# Patient Record
Sex: Male | Born: 1945 | Race: Black or African American | Hispanic: No | Marital: Single | Smoking: Never smoker
Health system: Southern US, Community
[De-identification: ages and names within clinical notes are randomized; demographics above are authoritative.]

## PROBLEM LIST (undated history)

## (undated) DIAGNOSIS — Z789 Other specified health status: Secondary | ICD-10-CM

## (undated) DIAGNOSIS — I10 Essential (primary) hypertension: Secondary | ICD-10-CM

## (undated) HISTORY — PX: COLON SURGERY: SHX602

## (undated) HISTORY — PX: COLOSTOMY: SHX63

---

## 2006-06-19 ENCOUNTER — Inpatient Hospital Stay (HOSPITAL_COMMUNITY): Admission: EM | Admit: 2006-06-19 | Discharge: 2006-06-28 | Payer: Self-pay | Admitting: Emergency Medicine

## 2013-11-11 ENCOUNTER — Encounter (HOSPITAL_COMMUNITY): Payer: Self-pay | Admitting: Emergency Medicine

## 2013-11-11 ENCOUNTER — Emergency Department (HOSPITAL_COMMUNITY)
Admission: EM | Admit: 2013-11-11 | Discharge: 2013-11-11 | Disposition: A | Discharge status: Home / Self Care | Payer: Medicare PPO | Attending: Emergency Medicine | Admitting: Emergency Medicine

## 2013-11-11 ENCOUNTER — Emergency Department (HOSPITAL_COMMUNITY): Payer: Medicare PPO

## 2013-11-11 DIAGNOSIS — Z23 Encounter for immunization: Secondary | ICD-10-CM | POA: Insufficient documentation

## 2013-11-11 DIAGNOSIS — S61309A Unspecified open wound of unspecified finger with damage to nail, initial encounter: Secondary | ICD-10-CM

## 2013-11-11 DIAGNOSIS — Z792 Long term (current) use of antibiotics: Secondary | ICD-10-CM | POA: Insufficient documentation

## 2013-11-11 DIAGNOSIS — S62639B Displaced fracture of distal phalanx of unspecified finger, initial encounter for open fracture: Secondary | ICD-10-CM | POA: Insufficient documentation

## 2013-11-11 DIAGNOSIS — W230XXA Caught, crushed, jammed, or pinched between moving objects, initial encounter: Secondary | ICD-10-CM | POA: Insufficient documentation

## 2013-11-11 DIAGNOSIS — Y9389 Activity, other specified: Secondary | ICD-10-CM | POA: Insufficient documentation

## 2013-11-11 DIAGNOSIS — Y929 Unspecified place or not applicable: Secondary | ICD-10-CM | POA: Insufficient documentation

## 2013-11-11 MED ORDER — TETANUS-DIPHTH-ACELL PERTUSSIS 5-2.5-18.5 LF-MCG/0.5 IM SUSP
0.5000 mL | Freq: Once | INTRAMUSCULAR | Status: AC
  Administered 2013-11-11: 0.5 mL via INTRAMUSCULAR
  Filled 2013-11-11: qty 0.5

## 2013-11-11 MED ORDER — LIDOCAINE HCL (PF) 1 % IJ SOLN
5.0000 mL | Freq: Once | INTRAMUSCULAR | Status: AC
  Administered 2013-11-11: 5 mL

## 2013-11-11 MED ORDER — HYDROCODONE-ACETAMINOPHEN 5-325 MG PO TABS: ORAL_TABLET | ORAL | Status: DC

## 2013-11-11 MED ORDER — SULFAMETHOXAZOLE-TRIMETHOPRIM 800-160 MG PO TABS: 1.0000 | ORAL_TABLET | Freq: Two times a day (BID) | ORAL | Status: DC

## 2013-11-11 MED ORDER — LIDOCAINE HCL (PF) 1 % IJ SOLN
INTRAMUSCULAR | Status: AC
  Administered 2013-11-11: 5 mL
  Filled 2013-11-11: qty 5

## 2013-11-11 NOTE — ED Notes (Signed)
Finger splint applied and dressing applied by other ED RN.

## 2013-11-11 NOTE — Discharge Instructions (Signed)
Fingertip Injuries and Amputations Fingertip injuries are common and often get injured because they are last to escape when pulling your hand out of harm's way. You have amputated (cut off) part of your finger. How this turns out depends largely on how much was amputated. If just the tip is amputated, often the end of the finger will grow back and the finger may return to much the same as it was before the injury.  If more of the finger is missing, your caregiver has done the best with the tissue remaining to allow you to keep as much finger as is possible. Your caregiver after checking your injury has tried to leave you with a painless fingertip that has durable, feeling skin. If possible, your caregiver has tried to maintain the finger's length and appearance and preserve its fingernail.  Please read the instructions outlined below and refer to this sheet in the next few weeks. These instructions provide you with general information on caring for yourself. Your caregiver may also give you specific instructions. While your treatment has been done according to the most current medical practices available, unavoidable complications occasionally occur. If you have any problems or questions after discharge, please call your caregiver. HOME CARE INSTRUCTIONS   You may resume normal diet and activities as directed or allowed.  Keep your hand elevated above the level of your heart. This helps decrease pain and swelling.  Keep ice packs (or a bag of ice wrapped in a towel) on the injured area for 15-20 minutes, 03-04 times per day, for the first two days.  Change dressings if necessary or as directed.  Clean the wound daily or as directed.  Only take over-the-counter or prescription medicines for pain, discomfort, or fever as directed by your caregiver.  Keep appointments as directed. SEEK IMMEDIATE MEDICAL CARE IF:  You develop redness, swelling, numbness or increasing pain in the wound.  There is  pus coming from the wound.  You develop an unexplained oral temperature above 102 F (38.9 C) or as your caregiver suggests.  There is a foul (bad) smell coming from the wound or dressing.  There is a breaking open of the wound (edges not staying together) after sutures or staples have been removed. MAKE SURE YOU:   Understand these instructions.  Will watch your condition.  Will get help right away if you are not doing well or get worse. Document Released: 07/25/2005 Document Revised: 11/26/2011 Document Reviewed: 06/23/2008 [facility] Patient Information 2014 [place], [place].  Finger Fracture A finger fracture is when one or more bones in the finger break.  HOME CARE   Wear the splint, tape, or cast as long as told by your doctor.  Keep your fingers in the position your doctor tell you to.  Raise (elevate) the injured area above the level of the heart.  Only take medicine as told by your doctor.  Put ice on the injured area.  Put ice in a plastic bag.  Place a towel between the skin and the bag.  Leave the ice on for 15-20 minutes, 03-04 times a day.  Follow up with your doctor.  Ask what exercises you can do when the splint comes off. GET HELP RIGHT AWAY IF:   The fingernails are white or bluish.  You have pain not helped by medicine.  You cannot move your fingertips.  You lose feeling (numbness) in the injured finger(s). MAKE SURE YOU:   Understand these instructions.  Will watch this condition.  Will  get help right away if you are not doing well or get worse. Document Released: 02/20/2008 Document Revised: 11/26/2011 Document Reviewed: 02/20/2008 [facility] Patient Information 2014 [place], [place].

## 2013-11-11 NOTE — ED Notes (Signed)
Crushed right hand ring finger between 2 pieces of wood this morning.

## 2013-11-11 NOTE — ED Notes (Signed)
Per verbal order, suture tray, 1% lidocaine and sterile gloves placed at bedside.

## 2013-11-12 NOTE — ED Provider Notes (Signed)
CSN: [id number]     Arrival date & time 11/11/13  1127 History   First MD Initiated Contact with Patient 11/11/13 1145     Chief Complaint  Patient presents with  . Finger Injury     (Consider location/radiation/quality/duration/timing/severity/associated sxs/prior Treatment) HPI Comments: Scott Matthews is a 68 y.o. male who presents to the Emergency Department complaining of pain and crush injury to the right ring finger that occurred just prior to ED arrival.  Patient reports deformity of the nail.  Bleeding was controlled with pressure according to patient.  He states that his finger was "mashed" between two large pieces of wood.  He c/o pain with movement of the end of the finger.  He denies numbness or weakness of the finger.  He denies taking blood thinners.  Last tetanus vaccination is unknown.    The history is provided by the patient.    History reviewed. No pertinent past medical history. Past Surgical History  Procedure Laterality Date  . Colon surgery    . Colostomy     History reviewed. No pertinent family history. History  Substance Use Topics  . Smoking status: Never Smoker   . Smokeless tobacco: Not on file  . Alcohol Use: No    Review of Systems  Constitutional: Negative for fever and chills.  Musculoskeletal: Negative for arthralgias, back pain and joint swelling.  Skin: Positive for wound.       Laceration and nail injury of the right ring finger   Neurological: Negative for dizziness, weakness and numbness.  Hematological: Does not bruise/bleed easily.  All other systems reviewed and are negative.      Allergies  Review of patient's allergies indicates no known allergies.  Home Medications   Current Outpatient Rx  Name  Route  Sig  Dispense  Refill  . HYDROcodone-acetaminophen (NORCO/VICODIN) 5-325 MG per tablet      Take one-two tabs po q 4-6 hrs prn pain   24 tablet   0   . sulfamethoxazole-trimethoprim (SEPTRA DS) 800-160 MG per  tablet   Oral   Take 1 tablet by mouth 2 (two) times daily.   20 tablet   0    Pulse 61  Temp(Src) 97.7 F (36.5 C) (Oral)  Resp 18  Ht 5\' 7"  (1.702 m)  Wt 160 lb (72.576 kg)  BMI 25.05 kg/m2  SpO2 100% Physical Exam  Nursing note and vitals reviewed. Constitutional: He is oriented to person, place, and time. He appears well-developed and well-nourished. No distress.  HENT:  Head: Normocephalic and atraumatic.  Cardiovascular: Normal rate, regular rhythm, normal heart sounds and intact distal pulses.   No murmur heard. Pulmonary/Chest: Effort normal and breath sounds normal. No respiratory distress.  Musculoskeletal: He exhibits tenderness. He exhibits no edema.       Right hand: He exhibits tenderness. He exhibits normal range of motion, normal two-point discrimination and normal capillary refill. Normal sensation noted. Normal strength noted.       Hands: Crush injury to the distal right fourth finger with partial avulsion of the proximal nail.  Pt has full ROM  Of the DIP of the finger.  Bleeding controlled.  Radial pulse is brisk, distal sensation intact.  No proximal tenderness   Neurological: He is alert and oriented to person, place, and time. He exhibits normal muscle tone. Coordination normal.  Skin: Skin is warm and dry.    ED Course  NAIL anchored to nail bed Date/Time: 11/11/2013 12:54 PM Performed by: Trisha MangleRIPLETT, Loretta Doutt L.  Authorized by: Maxwell Caul Consent: Verbal consent obtained. written consent not obtained. Risks and benefits: risks, benefits and alternatives were discussed Consent given by: patient Patient understanding: patient states understanding of the procedure being performed Patient consent: the patient's understanding of the procedure matches consent given Imaging studies: imaging studies available Patient identity confirmed: verbally with patient Time out: Immediately prior to procedure a "time out" was called to verify the correct patient,  procedure, equipment, support staff and site/side marked as required. Anesthesia: digital block Local anesthetic: lidocaine 1% without epinephrine Anesthetic total: 2 ml Patient sedated: no Preparation: skin prepped with Betadine and sterile field established Patient tolerance: Patient tolerated the procedure well with no immediate complications. Comments: Location:  Right fourth finger  After anesthesia obtained, avulsed nail was replaced onto the nail bed and anchored using 2 interrupted sutures of 4-0 prolene.  Laceration of the cuticle was closed using 2 sutures of 4-0 prolene.  Two holes placed in the nail with disposable cautery to allow for drainage of the collected blood.     (including critical care time) Labs Review Labs Reviewed - No data to display Imaging Review Dg Finger Ring Right  11/11/2013   CLINICAL DATA:  Right index finger smashing injury.  EXAM: RIGHT RING FINGER 2+V  COMPARISON:  None.  FINDINGS: A comminuted fracture of the tuft of the distal phalanx is seen. No other fractures identified. No evidence of dislocation.  IMPRESSION: Comminuted fracture of the distal phalangeal tuft.   Electronically Signed   By: Myles Rosenthal M.D.   On: 11/11/2013 12:22    EKG Interpretation   None       MDM   Final diagnoses:  Open fracture of tuft of distal phalanx of finger  Nail avulsion, finger    Finger remains NV intact.  Pain improved.  Finger splinted and bandaged by nursing.  tdap updated.  Will prescribe antibiotic.  Pt requesting referral to local orthopedics.    Consulted Dr. Romeo Apple and discussed finding and ED treatment agrees to see pt in his office for recheck and pt agrees to plan , also advised to return for any signs of infection.  The patient appears reasonably screened and/or stabilized for discharge and I doubt any other medical condition or other [facility] requiring further screening, evaluation, or treatment in the ED at this time prior to discharge.       Owyn Raulston L. Trisha Mangle, PA-C 11/12/13 1739

## 2013-11-12 NOTE — ED Provider Notes (Signed)
Medical screening examination/treatment/procedure(s) were performed by non-physician practitioner and as supervising physician I was immediately available for consultation/collaboration.  EKG Interpretation   None         Adith Tejada M Jamier Urbas, DO 11/12/13 1849 

## 2013-11-17 ENCOUNTER — Encounter: Payer: Self-pay | Admitting: Orthopedic Surgery

## 2013-11-17 ENCOUNTER — Ambulatory Visit (INDEPENDENT_AMBULATORY_CARE_PROVIDER_SITE_OTHER): Payer: Medicare PPO | Admitting: Orthopedic Surgery

## 2013-11-17 VITALS — BP 176/106 | Ht 67.0 in | Wt 148.0 lb

## 2013-11-17 DIAGNOSIS — S62639B Displaced fracture of distal phalanx of unspecified finger, initial encounter for open fracture: Secondary | ICD-10-CM | POA: Insufficient documentation

## 2013-11-17 NOTE — Patient Instructions (Signed)
Keep dressing dry

## 2013-11-17 NOTE — Progress Notes (Signed)
Patient ID: Arthor CaptainWaymon C Hulet, male   DOB: 05/12/1946, 68 y.o.   MRN: [id number]  Chief Complaint  Patient presents with  . Follow-up    ER follow up Right ring finger fracture DOI 11/11/13    68 year old male injured on 11/11/2013 he closed the door smashed his finger between 2 sticks of wood  Complains of mild pain some numbness pain is intermittent. Treated in the ER successfully with stitching and repair of the nail. The fracture is considered open was cleaned out well in the ER and is on Bactrim  He reports a negative review of systems.  Vital signs: BP 176/106  Ht 5\' 7"  (1.702 m)  Wt 148 lb (67.132 kg)  BMI 23.17 kg/m2  General the patient is well-developed and well-nourished grooming and hygiene are normal Oriented x3 Mood and affect normal Ambulation normal  Inspection of the right small finger 5 of flexion at the DIP joint full extension joint is stable. Flexor tendon strength is probably intact but cannot be fully assessed at the DIP joint PIP joint is normal skin is clean. He has sutures around the nail which has been repaired he has good capillary refill and color with some decreased sensation of the fingertip  Recommend redressing continue antibiotics return on Monday to have the sutures removed

## 2013-11-23 ENCOUNTER — Ambulatory Visit (INDEPENDENT_AMBULATORY_CARE_PROVIDER_SITE_OTHER): Payer: Medicare PPO | Admitting: Orthopedic Surgery

## 2013-11-23 VITALS — BP 167/107 | Ht 67.0 in | Wt 148.0 lb

## 2013-11-23 DIAGNOSIS — S62639B Displaced fracture of distal phalanx of unspecified finger, initial encounter for open fracture: Secondary | ICD-10-CM | POA: Diagnosis not present

## 2013-11-23 NOTE — Patient Instructions (Signed)
Work on bending the finger

## 2013-11-23 NOTE — Progress Notes (Signed)
Patient ID: Scott Matthews, male   DOB: 1945-11-09, 68 y.o.   MRN: [id number]  Status post crush injury right ring finger distal phalanx with fracture and nailbed repair in ER  Comes in for suture removal area there no signs of infection typical nailbed changes. Stiffness of the DIP joint expected.  Patient instructions to work on flexion come back in 4 weeks all sutures were removed

## 2013-12-29 ENCOUNTER — Ambulatory Visit (INDEPENDENT_AMBULATORY_CARE_PROVIDER_SITE_OTHER): Payer: Medicare PPO | Admitting: Orthopedic Surgery

## 2013-12-29 VITALS — BP 162/117 | Ht 67.0 in | Wt 148.0 lb

## 2013-12-29 DIAGNOSIS — S62639B Displaced fracture of distal phalanx of unspecified finger, initial encounter for open fracture: Secondary | ICD-10-CM

## 2013-12-29 NOTE — Patient Instructions (Signed)
Normal activity   Nail growth will take 3 months

## 2013-12-29 NOTE — Progress Notes (Signed)
Patient ID: [name], male   DOB: 09/19/1945, 68 y.o.   MRN: [id number] Chief Complaint  Patient presents with  . Follow-up    4 week recheck right ring finger DIPJ (ROM CK) DOi 11/11/13   BP 162/117  Ht 5\' 7"  (1.702 m)  Wt 148 lb (67.132 kg)  BMI 23.17 kg/m2 Encounter Diagnosis  Name Primary?  . Fracture, finger, distal phalanx, open Yes   General appearance is normal, the patient is alert and oriented x3 with normal mood and affect.  The patient's finger looks good his motion is returned to normal he does have nail deformity all see him again in 3 months to see how that goes in  Is not having any pain

## 2014-04-06 ENCOUNTER — Ambulatory Visit (INDEPENDENT_AMBULATORY_CARE_PROVIDER_SITE_OTHER): Payer: Medicare PPO | Admitting: Orthopedic Surgery

## 2014-04-06 VITALS — BP 171/103 | Ht 67.0 in | Wt 148.0 lb

## 2014-04-06 DIAGNOSIS — IMO0001 Reserved for inherently not codable concepts without codable children: Secondary | ICD-10-CM

## 2014-04-06 DIAGNOSIS — S62639D Displaced fracture of distal phalanx of unspecified finger, subsequent encounter for fracture with routine healing: Secondary | ICD-10-CM

## 2014-04-06 NOTE — Progress Notes (Signed)
Patient ID: Scott Matthews, male   DOB: Jan 06, 1946, 68 y.o.   MRN: [id number] Chief Complaint  Patient presents with  . Follow-up    3 month recheck right ring finger nail growth, DOI 11/11/13   HISTORY: nail injury   Nail regrowth is good  Tip a little sensitive  Doing well  F/U prn

## 2014-11-12 ENCOUNTER — Encounter (HOSPITAL_COMMUNITY): Payer: Self-pay | Admitting: Emergency Medicine

## 2014-11-12 ENCOUNTER — Emergency Department (HOSPITAL_COMMUNITY): Payer: Commercial Managed Care - HMO

## 2014-11-12 ENCOUNTER — Inpatient Hospital Stay (HOSPITAL_COMMUNITY)
Admission: EM | Admit: 2014-11-12 | Discharge: 2014-11-16 | DRG: 603 | Disposition: A | Discharge status: Skilled Nursing Facility | Payer: Commercial Managed Care - HMO | Attending: Internal Medicine | Admitting: Internal Medicine

## 2014-11-12 DIAGNOSIS — E876 Hypokalemia: Secondary | ICD-10-CM | POA: Diagnosis not present

## 2014-11-12 DIAGNOSIS — I878 Other specified disorders of veins: Secondary | ICD-10-CM | POA: Insufficient documentation

## 2014-11-12 DIAGNOSIS — R531 Weakness: Secondary | ICD-10-CM | POA: Diagnosis not present

## 2014-11-12 DIAGNOSIS — L0390 Cellulitis, unspecified: Secondary | ICD-10-CM | POA: Diagnosis not present

## 2014-11-12 DIAGNOSIS — R262 Difficulty in walking, not elsewhere classified: Secondary | ICD-10-CM | POA: Diagnosis not present

## 2014-11-12 DIAGNOSIS — S81801D Unspecified open wound, right lower leg, subsequent encounter: Secondary | ICD-10-CM | POA: Diagnosis not present

## 2014-11-12 DIAGNOSIS — D649 Anemia, unspecified: Secondary | ICD-10-CM | POA: Diagnosis not present

## 2014-11-12 DIAGNOSIS — L03116 Cellulitis of left lower limb: Principal | ICD-10-CM | POA: Diagnosis present

## 2014-11-12 DIAGNOSIS — L03115 Cellulitis of right lower limb: Secondary | ICD-10-CM | POA: Diagnosis present

## 2014-11-12 DIAGNOSIS — I10 Essential (primary) hypertension: Secondary | ICD-10-CM | POA: Diagnosis present

## 2014-11-12 DIAGNOSIS — L03119 Cellulitis of unspecified part of limb: Secondary | ICD-10-CM | POA: Diagnosis not present

## 2014-11-12 DIAGNOSIS — S81801A Unspecified open wound, right lower leg, initial encounter: Secondary | ICD-10-CM | POA: Diagnosis not present

## 2014-11-12 DIAGNOSIS — S81802A Unspecified open wound, left lower leg, initial encounter: Secondary | ICD-10-CM | POA: Diagnosis not present

## 2014-11-12 DIAGNOSIS — Z8249 Family history of ischemic heart disease and other diseases of the circulatory system: Secondary | ICD-10-CM | POA: Diagnosis not present

## 2014-11-12 DIAGNOSIS — M6281 Muscle weakness (generalized): Secondary | ICD-10-CM | POA: Diagnosis not present

## 2014-11-12 DIAGNOSIS — Z933 Colostomy status: Secondary | ICD-10-CM | POA: Diagnosis not present

## 2014-11-12 DIAGNOSIS — S81802D Unspecified open wound, left lower leg, subsequent encounter: Secondary | ICD-10-CM | POA: Diagnosis not present

## 2014-11-12 DIAGNOSIS — R278 Other lack of coordination: Secondary | ICD-10-CM | POA: Diagnosis not present

## 2014-11-12 DIAGNOSIS — I160 Hypertensive urgency: Secondary | ICD-10-CM | POA: Diagnosis present

## 2014-11-12 DIAGNOSIS — R030 Elevated blood-pressure reading, without diagnosis of hypertension: Secondary | ICD-10-CM | POA: Diagnosis not present

## 2014-11-12 HISTORY — DX: Other specified health status: Z78.9

## 2014-11-12 LAB — COMPREHENSIVE METABOLIC PANEL
ALT: 22 U/L (ref 0–53)
ANION GAP: 7 (ref 5–15)
AST: 27 U/L (ref 0–37)
Albumin: 3.9 g/dL (ref 3.5–5.2)
Alkaline Phosphatase: 63 U/L (ref 39–117)
BUN: 16 mg/dL (ref 6–23)
CALCIUM: 8.9 mg/dL (ref 8.4–10.5)
CO2: 27 mmol/L (ref 19–32)
CREATININE: 0.98 mg/dL (ref 0.50–1.35)
Chloride: 107 mmol/L (ref 96–112)
GFR calc Af Amer: >90 mL/min (ref >90)
GFR, EST NON AFRICAN AMERICAN: 81 mL/min — AB (ref >90)
Glucose, Bld: 101 mg/dL — ABNORMAL HIGH (ref 70–99)
Potassium: 3.8 mmol/L (ref 3.5–5.1)
Sodium: 141 mmol/L (ref 135–145)
Total Bilirubin: 0.6 mg/dL (ref 0.3–1.2)
Total Protein: 7.9 g/dL (ref 6.0–8.3)

## 2014-11-12 LAB — CBC WITH DIFFERENTIAL/PLATELET
Basophils Absolute: 0.1 10*3/uL (ref 0.0–0.1)
Basophils Relative: 1 % (ref 0–1)
Eosinophils Absolute: 0.1 10*3/uL (ref 0.0–0.7)
Eosinophils Relative: 1 % (ref 0–5)
HEMATOCRIT: 40.4 % (ref 39.0–52.0)
HEMOGLOBIN: 12.9 g/dL — AB (ref 13.0–17.0)
LYMPHS PCT: 20 % (ref 12–46)
Lymphs Abs: 1.3 10*3/uL (ref 0.7–4.0)
MCH: 25.3 pg — ABNORMAL LOW (ref 26.0–34.0)
MCHC: 31.9 g/dL (ref 30.0–36.0)
MCV: 79.2 fL (ref 78.0–100.0)
MONO ABS: 0.5 10*3/uL (ref 0.1–1.0)
MONOS PCT: 8 % (ref 3–12)
NEUTROS ABS: 4.5 10*3/uL (ref 1.7–7.7)
Neutrophils Relative %: 70 % (ref 43–77)
Platelets: 244 10*3/uL (ref 150–400)
RBC: 5.1 MIL/uL (ref 4.22–5.81)
RDW: 14.5 % (ref 11.5–15.5)
WBC: 6.5 10*3/uL (ref 4.0–10.5)

## 2014-11-12 MED ORDER — ACETAMINOPHEN 325 MG PO TABS
650.0000 mg | ORAL_TABLET | Freq: Four times a day (QID) | ORAL | Status: DC | PRN
  Administered 2014-11-13: 650 mg via ORAL
  Filled 2014-11-12: qty 2

## 2014-11-12 MED ORDER — SODIUM CHLORIDE 0.9 % IV SOLN: INTRAVENOUS | Status: DC

## 2014-11-12 MED ORDER — HYDRALAZINE HCL 20 MG/ML IJ SOLN
5.0000 mg | Freq: Once | INTRAMUSCULAR | Status: AC
Start: 2014-11-12 — End: 2014-11-12
  Administered 2014-11-12: 21:00:00 via INTRAVENOUS
  Filled 2014-11-12: qty 1

## 2014-11-12 MED ORDER — ONDANSETRON HCL 4 MG PO TABS: 4.0000 mg | ORAL_TABLET | Freq: Four times a day (QID) | ORAL | Status: DC | PRN

## 2014-11-12 MED ORDER — ENOXAPARIN SODIUM 40 MG/0.4ML ~~LOC~~ SOLN
40.0000 mg | SUBCUTANEOUS | Status: DC
  Administered 2014-11-12 – 2014-11-15 (×4): 40 mg via SUBCUTANEOUS
  Filled 2014-11-12 (×4): qty 0.4

## 2014-11-12 MED ORDER — MORPHINE SULFATE 2 MG/ML IJ SOLN
1.0000 mg | INTRAMUSCULAR | Status: DC | PRN
  Administered 2014-11-13 – 2014-11-16 (×5): 1 mg via INTRAVENOUS
  Filled 2014-11-12 (×5): qty 1

## 2014-11-12 MED ORDER — HYDROCODONE-ACETAMINOPHEN 5-325 MG PO TABS
1.0000 | ORAL_TABLET | Freq: Once | ORAL | Status: AC
  Administered 2014-11-12: 1 via ORAL
  Filled 2014-11-12: qty 1

## 2014-11-12 MED ORDER — SODIUM CHLORIDE 0.9 % IV SOLN
INTRAVENOUS | Status: DC
  Administered 2014-11-12 – 2014-11-13 (×2): via INTRAVENOUS

## 2014-11-12 MED ORDER — ACETAMINOPHEN 650 MG RE SUPP: 650.0000 mg | Freq: Four times a day (QID) | RECTAL | Status: DC | PRN

## 2014-11-12 MED ORDER — HYDRALAZINE HCL 20 MG/ML IJ SOLN
5.0000 mg | INTRAMUSCULAR | Status: DC | PRN
  Administered 2014-11-14: 5 mg via INTRAVENOUS
  Filled 2014-11-12: qty 1

## 2014-11-12 MED ORDER — ONDANSETRON HCL 4 MG/2ML IJ SOLN: 4.0000 mg | Freq: Four times a day (QID) | INTRAMUSCULAR | Status: DC | PRN

## 2014-11-12 MED ORDER — OXYCODONE HCL 5 MG PO TABS
5.0000 mg | ORAL_TABLET | ORAL | Status: DC | PRN
Start: 2014-11-12 — End: 2014-11-16
  Administered 2014-11-12 – 2014-11-16 (×9): 5 mg via ORAL
  Filled 2014-11-12 (×10): qty 1

## 2014-11-12 MED ORDER — VANCOMYCIN HCL IN DEXTROSE 1-5 GM/200ML-% IV SOLN
1000.0000 mg | Freq: Two times a day (BID) | INTRAVENOUS | Status: DC
  Administered 2014-11-13 – 2014-11-16 (×7): 1000 mg via INTRAVENOUS
  Filled 2014-11-12 (×9): qty 200

## 2014-11-12 MED ORDER — VANCOMYCIN HCL IN DEXTROSE 1-5 GM/200ML-% IV SOLN
1000.0000 mg | Freq: Once | INTRAVENOUS | Status: AC
  Administered 2014-11-12: 1000 mg via INTRAVENOUS
  Filled 2014-11-12: qty 200

## 2014-11-12 MED ORDER — CIPROFLOXACIN IN D5W 400 MG/200ML IV SOLN
400.0000 mg | Freq: Two times a day (BID) | INTRAVENOUS | Status: DC
  Administered 2014-11-12 – 2014-11-14 (×5): 400 mg via INTRAVENOUS
  Filled 2014-11-12 (×5): qty 200

## 2014-11-12 NOTE — H&P (Signed)
[facility] History and Physical  Scott CaptainWaymon C Schmoker WUJ:[id number] DOB: 11/16/1945 DOA: 11/12/2014  Referring physician: ER physician. PCP: Cassell SmilesFUSCO,LAWRENCE J., MD   Chief Complaint: Lower extremity wound.  HPI: Scott Matthews is a 69 y.o. 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 Patient states he sustained injury while cutting logs of wood. Patient states he has mild discharge from the wound. On exam patient has skin excoriation with erythema and swelling of the both lower extremities. Denies any fever or chills. In the ER in addition patient is found to have very elevated blood pressure. Patient states he was told he had elevated blood pressure last year but he did not follow-up. Patient otherwise denies any chest pain shortness of breath.   Review of Systems: As presented in the history of presenting illness, rest negative.  Past Medical History  Diagnosis Date  . Medical history non-contributory    Past Surgical History  Procedure Laterality Date  . Colon surgery    . Colostomy     Social History:  reports that he has never smoked. He has never used smokeless tobacco. He reports that he does not drink alcohol or use illicit drugs. Where does patient live at home. Can patient participate in ADLs? Yes.  No Known Allergies  Family History:  Family History  Problem Relation Age of Onset  . Hypertension Mother   . Hypertension Father   . CAD Father       Prior to Admission medications   Medication Sig Start Date End Date Taking? Authorizing Provider  HYDROcodone-acetaminophen (NORCO/VICODIN) 5-325 MG per tablet Take one-two tabs po q 4-6 hrs prn pain 11/11/13   Tammy L. Triplett, PA-C  sulfamethoxazole-trimethoprim (SEPTRA DS) 800-160 MG per tablet Take 1  tablet by mouth 2 (two) times daily. 11/11/13   Tammy L. Triplett, PA-C    Physical Exam: Filed Vitals:   11/12/14 1736 11/12/14 2053  BP: 166/145 196/117  Pulse: 84   Temp: 98 F (36.7 C)   TempSrc: Oral   Resp: 18   Height: 5\' 7"  (1.702 m)   Weight: 72.576 kg (160 lb)   SpO2: 100%      General:  Moderately built and nourished.  Eyes: Anicteric no pallor.  ENT: No discharge from the ears eyes nose and mouth.  Neck: No mass felt.  Cardiovascular: S1 and S2 heard.  Respiratory: No rhonchi or crepitations.  Abdomen: Colostomy bag seen. Nontender soft.  Skin: Bilateral anterior shin has skin excoriation with erythema and swelling.  Musculoskeletal: Bilateral pedal edema.  Psychiatric: Appears normal.  Neurologic: Alert awake oriented to time place and person. Moves all extremities.  Labs on Admission:  Basic Metabolic Panel:  Recent Labs Lab 11/12/14 1845  NA 141  K 3.8  CL 107  CO2 27  GLUCOSE 101*  BUN 16  CREATININE 0.98  CALCIUM 8.9   Liver Function Tests:  Recent Labs Lab 11/12/14 1845  AST 27  ALT 22  ALKPHOS 63  BILITOT 0.6  PROT 7.9  ALBUMIN 3.9   No results for input(s): LIPASE, AMYLASE in the last 168 hours. No results for input(s): AMMONIA in the last 168 hours. CBC:  Recent Labs Lab 11/12/14 1845  WBC 6.5  NEUTROABS 4.5  HGB 12.9*  HCT 40.4  MCV 79.2  PLT 244   Cardiac Enzymes: No results for input(s): CKTOTAL, CKMB, CKMBINDEX, TROPONINI in the last 168 hours.  BNP (last 3 results) No results for input(s): BNP in the last 8760 hours.  ProBNP (last 3 results) No results for input(s): PROBNP in the last 8760 hours.  CBG: No results for input(s): GLUCAP in the last 168 hours.  Radiological Exams on Admission: Dg Tibia/fibula Left  11/12/2014   CLINICAL DATA:  Bilateral wounds to the mid anterior lower legs, Black and using. Patient was splitting wood 2 weeks ago and the wood hit the legs.  EXAM: LEFT TIBIA AND FIBULA  - 2 VIEW  COMPARISON:  None.  FINDINGS: Left tibia and fibula appear intact. No evidence of acute fracture or subluxation. No focal bone lesion or bone destruction. Bone cortex and trabecular architecture appear intact. No radiopaque soft tissue foreign bodies.  IMPRESSION: Negative.   Electronically Signed   By: Burman Nieves M.D.   On: 11/12/2014 18:24   Dg Tibia/fibula Right  11/12/2014   CLINICAL DATA:  Bilateral wounds to mid anterior lower legs, black, bruising. Hit in shins with wood while chopping wood 2 weeks ago.  EXAM: RIGHT TIBIA AND FIBULA - 2 VIEW  COMPARISON:  None.  FINDINGS: Mild anterior soft tissue swelling. No radiopaque foreign body. No underlying bony abnormality. No fracture, subluxation or dislocation.  IMPRESSION: No acute bony abnormality or radiopaque foreign body.   Electronically Signed   By: Charlett Nose M.D.   On: 11/12/2014 18:25     Assessment/Plan Active Problems:   Cellulitis   Hypertensive urgency   1. Cellulitis of the both lower extremity - patient states that he has had tetanus toxoid within the last 2 years. Patient has been placed on vancomycin and ciprofloxacin. Check sedimentation rate if elevated may need MRI. Wound consult. Patient has significant lower extremity edema check Dopplers to rule out DVT. 2. Hypertensive urgency - patient's blood pressure was more than 200 when he came. I have placed patient on when necessary IV hydralazine for now. Based on the response and blood pressure trends patient may need definite antihypertensives. 3. Mild anemia - check anemia panel. 4. History of colostomy.   DVT Prophylaxis Lovenox.  Code Status: Full code.  Family Communication: Family at the bedside.  Disposition Plan: Admit to inpatient.    Syona Wroblewski N. [facility] Pager [phone].  If 7PM-7AM, please contact night-coverage [email or web address] Password [facility] 11/12/2014, 8:56 PM

## 2014-11-12 NOTE — Progress Notes (Signed)
ANTIBIOTIC CONSULT NOTE - INITIAL  Pharmacy Consult for Vancomycin and Cipro Indication: cellulitis  No Known Allergies  Patient Measurements: Height: 5\' 7"  (170.2 cm) (Simultaneous filing. User may not have seen previous data.) Weight: 160 lb (72.576 kg) (Simultaneous filing. User may not have seen previous data.) IBW/kg (Calculated) : 66.1  Vital Signs: Temp: 98.1 F (36.7 C) (02/26 2220) Temp Source: Oral (02/26 2220) BP: 164/89 mmHg (02/26 2220) Pulse Rate: 83 (02/26 2220) Intake/Output from previous day:   Intake/Output from this shift: Total I/O In: -  Out: 225 [Urine:225]  Labs:  Recent Labs  11/12/14 1845  WBC 6.5  HGB 12.9*  PLT 244  CREATININE 0.98   Estimated Creatinine Clearance: 65.6 mL/min (by C-G formula based on Cr of 0.98). No results for input(s): VANCOTROUGH, VANCOPEAK, VANCORANDOM, GENTTROUGH, GENTPEAK, GENTRANDOM, TOBRATROUGH, TOBRAPEAK, TOBRARND, AMIKACINPEAK, AMIKACINTROU, AMIKACIN in the last 72 hours.   Microbiology: No results found for this or any previous visit (from the past 720 hour(s)).  Medical History: Past Medical History  Diagnosis Date  . Medical history non-contributory    Anti-infectives    Start     Dose/Rate Route Frequency Ordered Stop   11/13/14 1000  vancomycin (VANCOCIN) IVPB 1000 mg/200 mL premix     1,000 mg 200 mL/hr over 60 Minutes Intravenous Every 12 hours 11/12/14 2306     11/12/14 2330  ciprofloxacin (CIPRO) IVPB 400 mg     400 mg 200 mL/hr over 60 Minutes Intravenous Every 12 hours 11/12/14 2306     11/12/14 2015  vancomycin (VANCOCIN) IVPB 1000 mg/200 mL premix     1,000 mg 200 mL/hr over 60 Minutes Intravenous  Once 11/12/14 2001 11/12/14 2153      Assessment: 68yo male with cellulitis to LE.  Pt has good renal fxn.    Goal of Therapy:  Vancomycin trough level 10-15 mcg/ml  Plan:  Cipro 400mg  IV q12hrs Vancomycin 1000mg  IV q12hrs Check trough at steady state Deescalate ABX when  appropriate Monitor labs and cultures  Valrie HartHall, Madylyn Insco A 11/12/2014,11:07 PM

## 2014-11-12 NOTE — ED Notes (Signed)
Aida PufferLaurie Taylor notified of patient's vital signs.

## 2014-11-12 NOTE — ED Provider Notes (Signed)
CSN: [id number]     Arrival date & time 11/12/14  1732 History   First MD Initiated Contact with Patient 11/12/14 1735     Chief Complaint  Patient presents with  . Wound Infection     (Consider location/radiation/quality/duration/timing/severity/associated sxs/prior Treatment) HPI Comments: Patient presents via EMS with redness and infection to bilateral lower legs. He states he was splitting wood about 2 weeks ago and a piece of board hit his left shin and the metal of the accident his right shin. He was not seen after the injury. Reports worsening pain, redness and swelling over the past several weeks and pain with ambulation. Denies any fevers or vomiting. He is not a diabetic. 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 Called EMS tonight with worsening pain and spreading redness up his shins.  The history is provided by the patient and the EMS personnel.    Past Medical History  Diagnosis Date  . Medical history non-contributory    Past Surgical History  Procedure Laterality Date  . Colon surgery    . Colostomy     Family History  Problem Relation Age of Onset  . Hypertension Mother   . Hypertension Father   . CAD Father    History  Substance Use Topics  . Smoking status: Never Smoker   . Smokeless tobacco: Never Used  . Alcohol Use: No    Review of Systems  Constitutional: Positive for activity change and appetite change. Negative for fever.  HENT: Negative for congestion and rhinorrhea.   Respiratory: Negative for cough, chest tightness and shortness of breath.   Cardiovascular: Positive for leg swelling. Negative for chest pain.  Gastrointestinal: Negative for nausea, vomiting and abdominal pain.  Genitourinary: Negative for dysuria and hematuria.  Musculoskeletal: Negative for myalgias, back pain and arthralgias.  Skin: Positive for wound.  Neurological: Negative for dizziness, weakness and headaches.  A complete 10 system review of  systems was obtained and all systems are negative except as noted in the HPI and PMH.      Allergies  Review of patient's allergies indicates no known allergies.  Home Medications   Prior to Admission medications   Not on File   BP 164/89 mmHg  Pulse 83  Temp(Src) 98.1 F (36.7 C) (Oral)  Resp 18  Ht  (1.702 m)  Wt 160 lb (72.576 kg)  BMI 25.05 kg/m2  SpO2 100% Physical Exam  Constitutional: He is oriented to person, place, and time. He appears well-developed and well-nourished. No distress.  HENT:  Head: Normocephalic and atraumatic.  Mouth/Throat: Oropharynx is clear and moist. No oropharyngeal exudate.  Eyes: Conjunctivae and EOM are normal. Pupils are equal, round, and reactive to light.  Neck: Normal range of motion. Neck supple.  No meningismus.  Cardiovascular: Normal rate, regular rhythm, normal heart sounds and intact distal pulses.   No murmur heard. Pulmonary/Chest: Effort normal and breath sounds normal. No respiratory distress.  Abdominal: Soft. There is no tenderness. There is no rebound and no guarding.  Musculoskeletal: Normal range of motion. He exhibits edema and tenderness.  Cellulitis to bilateral Lower extremities with open crusted wounds.  Erythema to proximal tibia bilaterally Intact DP and PT pulses  Neurological: He is alert and oriented to person, place, and time. No cranial nerve deficit. He exhibits normal muscle tone. Coordination normal.  No ataxia on finger to nose bilaterally. No pronator drift. 5/5 strength throughout. CN 2-12 intact. Negative Romberg. Equal grip strength. Sensation intact. Gait is normal.  Skin: Skin is warm.  Psychiatric: He has a normal mood and affect. His behavior is normal.  Nursing note and vitals reviewed.     ED Course  Procedures (including critical care time) Labs Review Labs Reviewed  CBC WITH DIFFERENTIAL/PLATELET - Abnormal; Notable for the following:    Hemoglobin 12.9 (*)    MCH 25.3 (*)     All other components within normal limits  COMPREHENSIVE METABOLIC PANEL - Abnormal; Notable for the following:    Glucose, Bld 101 (*)    GFR calc non Af Amer 81 (*)    All other components within normal limits  SEDIMENTATION RATE  C-REACTIVE PROTEIN  COMPREHENSIVE METABOLIC PANEL  CBC WITH DIFFERENTIAL/PLATELET  CBC  CBC  CREATININE, SERUM    Imaging Review Dg Tibia/fibula Left  11/12/2014   CLINICAL DATA:  Bilateral wounds to the mid anterior lower legs, Black and using. Patient was splitting wood 2 weeks ago and the wood hit the legs.  EXAM: LEFT TIBIA AND FIBULA - 2 VIEW  COMPARISON:  None.  FINDINGS: Left tibia and fibula appear intact. No evidence of acute fracture or subluxation. No focal bone lesion or bone destruction. Bone cortex and trabecular architecture appear intact. No radiopaque soft tissue foreign bodies.  IMPRESSION: Negative.   Electronically Signed   By: Burman NievesWilliam  Stevens M.D.   On: 11/12/2014 18:24   Dg Tibia/fibula Right  11/12/2014   CLINICAL DATA:  Bilateral wounds to mid anterior lower legs, black, bruising. Hit in shins with wood while chopping wood 2 weeks ago.  EXAM: RIGHT TIBIA AND FIBULA - 2 VIEW  COMPARISON:  None.  FINDINGS: Mild anterior soft tissue swelling. No radiopaque foreign body. No underlying bony abnormality. No fracture, subluxation or dislocation.  IMPRESSION: No acute bony abnormality or radiopaque foreign body.   Electronically Signed   By: Charlett NoseKevin  Dover M.D.   On: 11/12/2014 18:25     EKG Interpretation None      MDM   Final diagnoses:  Cellulitis of lower extremity, unspecified laterality   cellulitis to lower extremities after wound obtained 2 weeks ago. Tetanus up-to-date. Afebrile. Vital stable. Nonseptic and nontoxic appearing.  Xrays negative for FB. Start antibiotics for cellulitis.  No leukocytosis or fever. Does not appear septic. BP elevated.  Patient reports no history of the same. No chest pain, SOB, headache.  Admission  d/w Dr. Toniann FailKakrakandy.   Glynn OctaveStephen Ashutosh Dieguez, MD 11/12/14 [phone]

## 2014-11-12 NOTE — ED Notes (Signed)
Pt reports he was splitting wood and the wood hit one leg and the wedge hit the other leg. Pt now has infection in bilat ant lower legs.

## 2014-11-13 DIAGNOSIS — R030 Elevated blood-pressure reading, without diagnosis of hypertension: Secondary | ICD-10-CM

## 2014-11-13 DIAGNOSIS — E876 Hypokalemia: Secondary | ICD-10-CM

## 2014-11-13 LAB — RETICULOCYTES
RBC.: 4.51 MIL/uL (ref 4.22–5.81)
Retic Count, Absolute: 40.6 10*3/uL (ref 19.0–186.0)
Retic Ct Pct: 0.9 % (ref 0.4–3.1)

## 2014-11-13 LAB — CBC WITH DIFFERENTIAL/PLATELET
BASOS PCT: 1 % (ref 0–1)
Basophils Absolute: 0 10*3/uL (ref 0.0–0.1)
EOS ABS: 0.1 10*3/uL (ref 0.0–0.7)
Eosinophils Relative: 1 % (ref 0–5)
HCT: 35.6 % — ABNORMAL LOW (ref 39.0–52.0)
Hemoglobin: 11.3 g/dL — ABNORMAL LOW (ref 13.0–17.0)
Lymphocytes Relative: 22 % (ref 12–46)
Lymphs Abs: 1.4 10*3/uL (ref 0.7–4.0)
MCH: 25.1 pg — AB (ref 26.0–34.0)
MCHC: 31.7 g/dL (ref 30.0–36.0)
MCV: 78.9 fL (ref 78.0–100.0)
Monocytes Absolute: 0.5 10*3/uL (ref 0.1–1.0)
Monocytes Relative: 8 % (ref 3–12)
NEUTROS ABS: 4.3 10*3/uL (ref 1.7–7.7)
Neutrophils Relative %: 68 % (ref 43–77)
Platelets: 247 10*3/uL (ref 150–400)
RBC: 4.51 MIL/uL (ref 4.22–5.81)
RDW: 14.7 % (ref 11.5–15.5)
WBC: 6.4 10*3/uL (ref 4.0–10.5)

## 2014-11-13 LAB — SEDIMENTATION RATE: Sed Rate: 30 mm/hr — ABNORMAL HIGH (ref 0–16)

## 2014-11-13 LAB — COMPREHENSIVE METABOLIC PANEL
ALBUMIN: 3.1 g/dL — AB (ref 3.5–5.2)
ALK PHOS: 49 U/L (ref 39–117)
ALT: 17 U/L (ref 0–53)
ANION GAP: 4 — AB (ref 5–15)
AST: 20 U/L (ref 0–37)
BUN: 12 mg/dL (ref 6–23)
CALCIUM: 8.2 mg/dL — AB (ref 8.4–10.5)
CHLORIDE: 107 mmol/L (ref 96–112)
CO2: 26 mmol/L (ref 19–32)
Creatinine, Ser: 1.01 mg/dL (ref 0.50–1.35)
GFR calc Af Amer: 85 mL/min — ABNORMAL LOW (ref >90)
GFR calc non Af Amer: 73 mL/min — ABNORMAL LOW (ref >90)
Glucose, Bld: 107 mg/dL — ABNORMAL HIGH (ref 70–99)
Potassium: 3.3 mmol/L — ABNORMAL LOW (ref 3.5–5.1)
Sodium: 137 mmol/L (ref 135–145)
Total Bilirubin: 0.6 mg/dL (ref 0.3–1.2)
Total Protein: 6.1 g/dL (ref 6.0–8.3)

## 2014-11-13 MED ORDER — POTASSIUM CHLORIDE CRYS ER 20 MEQ PO TBCR
40.0000 meq | EXTENDED_RELEASE_TABLET | Freq: Once | ORAL | Status: AC
  Administered 2014-11-13: 40 meq via ORAL
  Filled 2014-11-13: qty 2

## 2014-11-13 MED ORDER — FUROSEMIDE 10 MG/ML IJ SOLN
20.0000 mg | Freq: Two times a day (BID) | INTRAMUSCULAR | Status: DC
  Administered 2014-11-13 – 2014-11-15 (×5): 20 mg via INTRAVENOUS
  Filled 2014-11-13 (×5): qty 2

## 2014-11-13 NOTE — Progress Notes (Signed)
Utilization review Completed Riely Oetken RN BSN   

## 2014-11-13 NOTE — Progress Notes (Signed)
[facility] PROGRESS NOTE  Scott Matthews CHE:[id number] DOB: 1946/07/17 DOA: 11/12/2014 PCP: Scott Herring., MD  Assessment/Plan: 1. Cellulitis. Patient presents with wounds/cellulitis bilateral lower extremities after having an accident while chopping wood. He reports his wounds have been present for approximately 2 weeks. ESR is elevated. Will check MRI to rule out any underlying deep tissue infection. He is on vancomycin and ciprofloxacin. We'll continue current antibiotics. He does have some lower extremity edema as well as likely contributing to his symptoms. We'll start the patient on low-dose Lasix 2. Elevated blood pressure. Patient had an episode of elevated blood pressure on admission which have been precipitated by pain/stress. He has not required any further antihypertensives. Blood pressures appear to be stable. Continue to monitor 3. Hypokalemia. Replace 4. Mild anemia. Anemia panel is in process.  Code Status: full code Family Communication: discussed with patient  Disposition Plan: discharge home once improved.   Consultants:  Wound care  Procedures:    Antibiotics:  Vancomycin 2/26  Ciprofloxacin 2/26  HPI/Subjective: No chest pain or shortness of breath, he does have tingling/burning in his legs  Objective: Filed Vitals:   11/13/14 1440  BP: 121/86  Pulse: 71  Temp: 98.9 F (37.2 C)  Resp: 18    Intake/Output Summary (Last 24 hours) at 11/13/14 1839 Last data filed at 11/13/14 1700  Gross per 24 hour  Intake    720 ml  Output    825 ml  Net   -105 ml   Filed Weights   11/12/14 1736  Weight: 72.576 kg (160 lb)    Exam:   General:  NAD  Cardiovascular: S1, S2 RRR  Respiratory: cta b  Abdomen: soft, nt, nd, bs+  Musculoskeletal: wounds noted on bilateral anterior shins that are foul smelling, without discharge and have dry eschar over wounds.   Data Reviewed: Basic Metabolic Panel:  Recent Labs Lab 11/12/14 1845  11/13/14 0613  NA 141 137  K 3.8 3.3*  CL 107 107  CO2 27 26  GLUCOSE 101* 107*  BUN 16 12  CREATININE 0.98 1.01  CALCIUM 8.9 8.2*   Liver Function Tests:  Recent Labs Lab 11/12/14 1845 11/13/14 0613  AST 27 20  ALT 22 17  ALKPHOS 63 49  BILITOT 0.6 0.6  PROT 7.9 6.1  ALBUMIN 3.9 3.1*   No results for input(s): LIPASE, AMYLASE in the last 168 hours. No results for input(s): AMMONIA in the last 168 hours. CBC:  Recent Labs Lab 11/12/14 1845 11/13/14 0613  WBC 6.5 6.4  NEUTROABS 4.5 4.3  HGB 12.9* 11.3*  HCT 40.4 35.6*  MCV 79.2 78.9  PLT 244 247   Cardiac Enzymes: No results for input(s): CKTOTAL, CKMB, CKMBINDEX, TROPONINI in the last 168 hours. BNP (last 3 results) No results for input(s): BNP in the last 8760 hours.  ProBNP (last 3 results) No results for input(s): PROBNP in the last 8760 hours.  CBG: No results for input(s): GLUCAP in the last 168 hours.  No results found for this or any previous visit (from the past 240 hour(s)).   Studies: Dg Tibia/fibula Left  11/12/2014   CLINICAL DATA:  Bilateral wounds to the mid anterior lower legs, Black and using. Patient was splitting wood 2 weeks ago and the wood hit the legs.  EXAM: LEFT TIBIA AND FIBULA - 2 VIEW  COMPARISON:  None.  FINDINGS: Left tibia and fibula appear intact. No evidence of acute fracture or subluxation. No focal bone lesion or bone destruction. Bone  cortex and trabecular architecture appear intact. No radiopaque soft tissue foreign bodies.  IMPRESSION: Negative.   Electronically Signed   By: Scott Matthews M.D.   On: 11/12/2014 18:24   Dg Tibia/fibula Right  11/12/2014   CLINICAL DATA:  Bilateral wounds to mid anterior lower legs, black, bruising. Hit in shins with wood while chopping wood 2 weeks ago.  EXAM: RIGHT TIBIA AND FIBULA - 2 VIEW  COMPARISON:  None.  FINDINGS: Mild anterior soft tissue swelling. No radiopaque foreign body. No underlying bony abnormality. No fracture,  subluxation or dislocation.  IMPRESSION: No acute bony abnormality or radiopaque foreign body.   Electronically Signed   By: Scott Matthews M.D.   On: 11/12/2014 18:25    Scheduled Meds: . ciprofloxacin  400 mg Intravenous Q12H  . enoxaparin (LOVENOX) injection  40 mg Subcutaneous Q24H  . vancomycin  1,000 mg Intravenous Q12H   Continuous Infusions: . sodium chloride 75 mL/hr at 11/13/14 1630    Active Problems:   Cellulitis   Hypertensive urgency    Time spent: 68mns    Scott Matthews  [facility] Pager [phone] If 7PM-7AM, please contact night-coverage at [email or web address], password [email or web address]2/27/2016, 6:39 PM  LOS: 1 day

## 2014-11-13 NOTE — Consult Note (Signed)
WOC wound consult note Reason for Consult: Bilateral traumatic wounds in pretibial areas, full thickness. Patient was splitting wound 2 weeks ago and a piece of wood hit him in this anatomic area.  Radiographic tests performed yesterday reveal no foreign objects/body (wood fragments) in wound. 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  His mother has a HHRN who advised the patient to come to the hospital. Wound type:Traumatic. Pressure Ulcer POA: No Measurement:LLE:  10cm x 4cm with central area measuring 4.5cm x 3.5cm. Open area and depth is obscured by the presence of dried serum vs eschar.  RLE:  12cm x 5cm with central area measuring 5cm x 3cm.  Open area and depth is obscured by the presence of dried serum vs eschar.  Wound bed:As above. Drainage (amount, consistency, odor) none Periwound: hemosiderin staining as well as warmth present.  Some edema, although nursing staff and patient agree that edema has subsided significantly since arrival in house with elevation. Dressing procedure/placement/frequency: Twice daily application of a saline dressing will both autolytically debride add cleanse these areas. Additionally, I will provide bilateral pressure redistribution boots for use while in bed. [facility] well as an outpatient [facility].  If you agree, please order either. WOC nursing team will not follow, but will remain available to this patient, the nursing and medical team.  Please re-consult if needed. Thanks, Ladona MowLaurie Zina Pitzer, MSN, RN, GNP, [place], CWON-AP [phone])

## 2014-11-14 DIAGNOSIS — D649 Anemia, unspecified: Secondary | ICD-10-CM

## 2014-11-14 DIAGNOSIS — L0390 Cellulitis, unspecified: Secondary | ICD-10-CM

## 2014-11-14 LAB — C-REACTIVE PROTEIN: CRP: 1.8 mg/dL — ABNORMAL HIGH (ref <0.60)

## 2014-11-14 LAB — IRON AND TIBC
Iron: 48 ug/dL (ref 42–165)
Saturation Ratios: 24 % (ref 20–55)
TIBC: 199 ug/dL — ABNORMAL LOW (ref 215–435)
UIBC: 151 ug/dL (ref 125–400)

## 2014-11-14 LAB — FOLATE: Folate: 15.7 ng/mL

## 2014-11-14 LAB — FERRITIN: Ferritin: 134 ng/mL (ref 22–322)

## 2014-11-14 LAB — VITAMIN B12: VITAMIN B 12: 415 pg/mL (ref 211–911)

## 2014-11-14 MED ORDER — POTASSIUM CHLORIDE CRYS ER 20 MEQ PO TBCR
40.0000 meq | EXTENDED_RELEASE_TABLET | ORAL | Status: AC
  Administered 2014-11-14 (×2): 40 meq via ORAL
  Filled 2014-11-14 (×2): qty 2

## 2014-11-14 MED ORDER — CHLORHEXIDINE GLUCONATE 4 % EX LIQD
Freq: Once | CUTANEOUS | Status: AC
  Administered 2014-11-14: 13:00:00 via TOPICAL
  Filled 2014-11-14: qty 15

## 2014-11-14 MED ORDER — CHLORHEXIDINE GLUCONATE CLOTH 2 % EX PADS: 1.0000 | MEDICATED_PAD | Freq: Once | CUTANEOUS | Status: DC

## 2014-11-14 NOTE — Consult Note (Signed)
Reason for Consult: Bilateral cellulitis, lower extremities Referring Physician: Hospitalist  ETHIN DRUMMOND is an 69 y.o. male.  HPI: Patient is a 69 year old black male who 2 weeks ago was chopping wood and had a large piece of wood injured both his left and right lower extremities in the pretibial region. He states they were graze injuries and no wood was impaled into the skin of either leg. He presented emergency room due to poor wound healing of the lower extremity wounds.  Past Medical History  Diagnosis Date  . Medical history non-contributory     Past Surgical History  Procedure Laterality Date  . Colon surgery    . Colostomy      Family History  Problem Relation Age of Onset  . Hypertension Mother   . Hypertension Father   . CAD Father     Social History:  reports that he has never smoked. He has never used smokeless tobacco. He reports that he does not drink alcohol or use illicit drugs.  Allergies: No Known Allergies  Medications: I have reviewed the patient's current medications.  Results for orders placed or performed during the hospital encounter of 11/12/14 (from the past 48 hour(s))  CBC with Differential/Platelet     Status: Abnormal   Collection Time: 11/12/14  6:45 PM  Result Value Ref Range   WBC 6.5 4.0 - 10.5 K/uL   RBC 5.10 4.22 - 5.81 MIL/uL   Hemoglobin 12.9 (L) 13.0 - 17.0 g/dL   HCT 40.4 39.0 - 52.0 %   MCV 79.2 78.0 - 100.0 fL   MCH 25.3 (L) 26.0 - 34.0 pg   MCHC 31.9 30.0 - 36.0 g/dL   RDW 14.5 11.5 - 15.5 %   Platelets 244 150 - 400 K/uL   Neutrophils Relative % 70 43 - 77 %   Neutro Abs 4.5 1.7 - 7.7 K/uL   Lymphocytes Relative 20 12 - 46 %   Lymphs Abs 1.3 0.7 - 4.0 K/uL   Monocytes Relative 8 3 - 12 %   Monocytes Absolute 0.5 0.1 - 1.0 K/uL   Eosinophils Relative 1 0 - 5 %   Eosinophils Absolute 0.1 0.0 - 0.7 K/uL   Basophils Relative 1 0 - 1 %   Basophils Absolute 0.1 0.0 - 0.1 K/uL  Comprehensive metabolic panel     Status:  Abnormal   Collection Time: 11/12/14  6:45 PM  Result Value Ref Range   Sodium 141 135 - 145 mmol/L   Potassium 3.8 3.5 - 5.1 mmol/L   Chloride 107 96 - 112 mmol/L   CO2 27 19 - 32 mmol/L   Glucose, Bld 101 (H) 70 - 99 mg/dL   BUN 16 6 - 23 mg/dL   Creatinine, Ser 0.98 0.50 - 1.35 mg/dL   Calcium 8.9 8.4 - 10.5 mg/dL   Total Protein 7.9 6.0 - 8.3 g/dL   Albumin 3.9 3.5 - 5.2 g/dL   AST 27 0 - 37 U/L   ALT 22 0 - 53 U/L   Alkaline Phosphatase 63 39 - 117 U/L   Total Bilirubin 0.6 0.3 - 1.2 mg/dL   GFR calc non Af Amer 81 (L) >90 mL/min   GFR calc Af Amer >90 >90 mL/min    Comment: (NOTE) The eGFR has been calculated using the CKD EPI equation. This calculation has not been validated in all clinical situations. eGFR's persistently <90 mL/min signify possible Chronic Kidney Disease.    Anion gap 7 5 - 15  Sedimentation rate     Status: Abnormal   Collection Time: 11/12/14  6:45 PM  Result Value Ref Range   Sed Rate 30 (H) 0 - 16 mm/hr  C-reactive protein     Status: Abnormal   Collection Time: 11/12/14  6:45 PM  Result Value Ref Range   CRP 1.8 (H) <0.60 mg/dL    Comment: Performed at [facility] metabolic panel     Status: Abnormal   Collection Time: 11/13/14  6:13 AM  Result Value Ref Range   Sodium 137 135 - 145 mmol/L   Potassium 3.3 (L) 3.5 - 5.1 mmol/L   Chloride 107 96 - 112 mmol/L   CO2 26 19 - 32 mmol/L   Glucose, Bld 107 (H) 70 - 99 mg/dL   BUN 12 6 - 23 mg/dL   Creatinine, Ser 1.01 0.50 - 1.35 mg/dL   Calcium 8.2 (L) 8.4 - 10.5 mg/dL   Total Protein 6.1 6.0 - 8.3 g/dL   Albumin 3.1 (L) 3.5 - 5.2 g/dL   AST 20 0 - 37 U/L   ALT 17 0 - 53 U/L   Alkaline Phosphatase 49 39 - 117 U/L   Total Bilirubin 0.6 0.3 - 1.2 mg/dL   GFR calc non Af Amer 73 (L) >90 mL/min   GFR calc Af Amer 85 (L) >90 mL/min    Comment: (NOTE) The eGFR has been calculated using the CKD EPI equation. This calculation has not been validated in all clinical  situations. eGFR's persistently <90 mL/min signify possible Chronic Kidney Disease.    Anion gap 4 (L) 5 - 15  CBC with Differential/Platelet     Status: Abnormal   Collection Time: 11/13/14  6:13 AM  Result Value Ref Range   WBC 6.4 4.0 - 10.5 K/uL   RBC 4.51 4.22 - 5.81 MIL/uL   Hemoglobin 11.3 (L) 13.0 - 17.0 g/dL   HCT 35.6 (L) 39.0 - 52.0 %   MCV 78.9 78.0 - 100.0 fL   MCH 25.1 (L) 26.0 - 34.0 pg   MCHC 31.7 30.0 - 36.0 g/dL   RDW 14.7 11.5 - 15.5 %   Platelets 247 150 - 400 K/uL   Neutrophils Relative % 68 43 - 77 %   Neutro Abs 4.3 1.7 - 7.7 K/uL   Lymphocytes Relative 22 12 - 46 %   Lymphs Abs 1.4 0.7 - 4.0 K/uL   Monocytes Relative 8 3 - 12 %   Monocytes Absolute 0.5 0.1 - 1.0 K/uL   Eosinophils Relative 1 0 - 5 %   Eosinophils Absolute 0.1 0.0 - 0.7 K/uL   Basophils Relative 1 0 - 1 %   Basophils Absolute 0.0 0.0 - 0.1 K/uL  Vitamin B12     Status: None   Collection Time: 11/13/14  6:13 AM  Result Value Ref Range   Vitamin B-12 415 211 - 911 pg/mL    Comment: Performed at [facility]  Folate     Status: None   Collection Time: 11/13/14  6:13 AM  Result Value Ref Range   Folate 15.7 ng/mL    Comment: (NOTE) Reference Ranges        Deficient:       0.4 - 3.3 ng/mL        Indeterminate:   3.4 - 5.4 ng/mL        Normal:              > 5.4 ng/mL Performed at [facility]   Iron and TIBC     Status: Abnormal   Collection Time: 11/13/14  6:13 AM  Result Value Ref Range   Iron 48 42 - 165 ug/dL   TIBC 199 (L) 215 - 435 ug/dL   Saturation Ratios 24 20 - 55 %   UIBC 151 125 - 400 ug/dL    Comment: Performed at [facility]  Ferritin     Status: None   Collection Time: 11/13/14  6:13 AM  Result Value Ref Range   Ferritin 134 22 - 322 ng/mL    Comment: Performed at [place]     Status: None   Collection Time: 11/13/14  6:13 AM  Result Value Ref Range   Retic Ct Pct 0.9 0.4 - 3.1 %   RBC. 4.51 4.22 - 5.81  MIL/uL   Retic Count, Manual 40.6 19.0 - 186.0 K/uL    Dg Tibia/fibula Left  11/12/2014   CLINICAL DATA:  Bilateral wounds to the mid anterior lower legs, Black and using. Patient was splitting wood 2 weeks ago and the wood hit the legs.  EXAM: LEFT TIBIA AND FIBULA - 2 VIEW  COMPARISON:  None.  FINDINGS: Left tibia and fibula appear intact. No evidence of acute fracture or subluxation. No focal bone lesion or bone destruction. Bone cortex and trabecular architecture appear intact. No radiopaque soft tissue foreign bodies.  IMPRESSION: Negative.   Electronically Signed   By: Lucienne Capers M.D.   On: 11/12/2014 18:24   Dg Tibia/fibula Right  11/12/2014   CLINICAL DATA:  Bilateral wounds to mid anterior lower legs, black, bruising. Hit in shins with wood while chopping wood 2 weeks ago.  EXAM: RIGHT TIBIA AND FIBULA - 2 VIEW  COMPARISON:  None.  FINDINGS: Mild anterior soft tissue swelling. No radiopaque foreign body. No underlying bony abnormality. No fracture, subluxation or dislocation.  IMPRESSION: No acute bony abnormality or radiopaque foreign body.   Electronically Signed   By: Rolm Baptise M.D.   On: 11/12/2014 18:25    ROS: See chart Blood pressure 157/90, pulse 86, temperature 99.6 F (37.6 C), temperature source Oral, resp. rate 20, height _0  (1.702 m), weight 72.576 kg (160 lb), SpO2 98 %. Physical Exam: Pleasant black male in no acute distress. Extremity examination reveals bilateral femoral, dorsalis pedis, and posterior tibial pulses present. Thin skin is noted in the pretibial regions. He has superficial wounds present anteriorly in both lower extremities. Dry eschars present, though this may be more seromatous in nature versus actual skin necrosis. No purulent drainage is noted.  Assessment/Plan: Impression: Poor wound healing, lower extremities Plan: Agree with wound care assessment. Would rule out foreign body. Would add light Hibiclens and sponge application to the wounds.  Will follow peripherally with you.  Jahnessa Vanduyn A 11/14/2014, 10:47 AM

## 2014-11-14 NOTE — Progress Notes (Signed)
Patients BLE wounds cleansed with hibiclens per orders from Dr. Lovell SheehanJenkins.  Dressings replaced.  Tolerate well.

## 2014-11-14 NOTE — Progress Notes (Signed)
[facility] PROGRESS NOTE  Scott Matthews WGN:[id number] DOB: [birth year] DOA: 11/12/2014 PCP: Glo Herring., MD  Assessment/Plan: 1. Cellulitis. Patient presents with wounds/cellulitis bilateral lower extremities after having an accident while chopping wood. He reports his wounds have been present for approximately 2 weeks. ESR is elevated. Will check MRI to rule out any underlying deep tissue infection. He is on vancomycin and ciprofloxacin. We'll continue current antibiotics. He does have some lower extremity edema as well as likely contributing to his symptoms which is improving with Lasix. Appreciate general surgery and wound care recommendations. No need for surgical debridement at this time 2. Elevated blood pressure. Patient had an episode of elevated blood pressure on admission which have been precipitated by pain/stress. He has not required any further antihypertensives. Blood pressures appear to be stable. Continue to monitor 3. Hypokalemia. Replace 4. Mild anemia. Anemia panel is unremarkable. May be related to acute illness/frequent blood draws. Continue to follow.  Code Status: full code Family Communication: discussed with patient  Disposition Plan: discharge home once improved.   Consultants:  Wound care  Gen surgery  Procedures:    Antibiotics:  Vancomycin 2/26  Ciprofloxacin 2/26  HPI/Subjective: No chest pain or shortness of breath, he does have tingling/burning in his legs  Objective: Filed Vitals:   11/14/14 1421  BP: 133/91  Pulse: 75  Temp: 98.5 F (36.9 C)  Resp: 20    Intake/Output Summary (Last 24 hours) at 11/14/14 1802 Last data filed at 11/14/14 1700  Gross per 24 hour  Intake   1480 ml  Output   3625 ml  Net  -2145 ml   Filed Weights   11/12/14 1736  Weight: 72.576 kg (160 lb)    Exam:   General:  NAD  Cardiovascular: S1, S2 RRR  Respiratory: cta b  Abdomen: soft, nt, nd, bs+  Musculoskeletal: edema in LE  improving. Both wounds are dressed with gauze. Still have mildly foul odor when dressing removed. Wounds look relatively unchanged..   Data Reviewed: Basic Metabolic Panel:  Recent Labs Lab 11/12/14 1845 11/13/14 0613  NA 141 137  K 3.8 3.3*  CL 107 107  CO2 27 26  GLUCOSE 101* 107*  BUN 16 12  CREATININE 0.98 1.01  CALCIUM 8.9 8.2*   Liver Function Tests:  Recent Labs Lab 11/12/14 1845 11/13/14 0613  AST 27 20  ALT 22 17  ALKPHOS 63 49  BILITOT 0.6 0.6  PROT 7.9 6.1  ALBUMIN 3.9 3.1*   No results for input(s): LIPASE, AMYLASE in the last 168 hours. No results for input(s): AMMONIA in the last 168 hours. CBC:  Recent Labs Lab 11/12/14 1845 11/13/14 0613  WBC 6.5 6.4  NEUTROABS 4.5 4.3  HGB 12.9* 11.3*  HCT 40.4 35.6*  MCV 79.2 78.9  PLT 244 247   Cardiac Enzymes: No results for input(s): CKTOTAL, CKMB, CKMBINDEX, TROPONINI in the last 168 hours. BNP (last 3 results) No results for input(s): BNP in the last 8760 hours.  ProBNP (last 3 results) No results for input(s): PROBNP in the last 8760 hours.  CBG: No results for input(s): GLUCAP in the last 168 hours.  No results found for this or any previous visit (from the past 240 hour(s)).   Studies: Dg Tibia/fibula Left  11/12/2014   CLINICAL DATA:  Bilateral wounds to the mid anterior lower legs, Black and using. Patient was splitting wood 2 weeks ago and the wood hit the legs.  EXAM: LEFT TIBIA AND FIBULA - 2 VIEW  COMPARISON:  None.  FINDINGS: Left tibia and fibula appear intact. No evidence of acute fracture or subluxation. No focal bone lesion or bone destruction. Bone cortex and trabecular architecture appear intact. No radiopaque soft tissue foreign bodies.  IMPRESSION: Negative.   Electronically Signed   By: Lucienne Capers M.D.   On: 11/12/2014 18:24   Dg Tibia/fibula Right  11/12/2014   CLINICAL DATA:  Bilateral wounds to mid anterior lower legs, black, bruising. Hit in shins with wood while  chopping wood 2 weeks ago.  EXAM: RIGHT TIBIA AND FIBULA - 2 VIEW  COMPARISON:  None.  FINDINGS: Mild anterior soft tissue swelling. No radiopaque foreign body. No underlying bony abnormality. No fracture, subluxation or dislocation.  IMPRESSION: No acute bony abnormality or radiopaque foreign body.   Electronically Signed   By: Rolm Baptise M.D.   On: 11/12/2014 18:25    Scheduled Meds: . ciprofloxacin  400 mg Intravenous Q12H  . enoxaparin (LOVENOX) injection  40 mg Subcutaneous Q24H  . furosemide  20 mg Intravenous BID  . potassium chloride  40 mEq Oral Q3H  . vancomycin  1,000 mg Intravenous Q12H   Continuous Infusions:    Active Problems:   Cellulitis   Hypertensive urgency    Time spent: 47mns    MEMON,JEHANZEB  [facility] Pager [phone] If 7PM-7AM, please contact night-coverage at [email or web address], password [facility]Sam[facility]2/28/2016, 6:02 PM  LOS: 2 days

## 2014-11-15 ENCOUNTER — Inpatient Hospital Stay (HOSPITAL_COMMUNITY): Payer: Commercial Managed Care - HMO

## 2014-11-15 DIAGNOSIS — I878 Other specified disorders of veins: Secondary | ICD-10-CM

## 2014-11-15 DIAGNOSIS — I10 Essential (primary) hypertension: Secondary | ICD-10-CM | POA: Insufficient documentation

## 2014-11-15 LAB — CBC
HCT: 39.8 % (ref 39.0–52.0)
HEMOGLOBIN: 12.8 g/dL — AB (ref 13.0–17.0)
MCH: 25.4 pg — ABNORMAL LOW (ref 26.0–34.0)
MCHC: 32.2 g/dL (ref 30.0–36.0)
MCV: 79.1 fL (ref 78.0–100.0)
PLATELETS: 255 10*3/uL (ref 150–400)
RBC: 5.03 MIL/uL (ref 4.22–5.81)
RDW: 14.3 % (ref 11.5–15.5)
WBC: 9.4 10*3/uL (ref 4.0–10.5)

## 2014-11-15 LAB — BASIC METABOLIC PANEL
ANION GAP: 8 (ref 5–15)
BUN: 17 mg/dL (ref 6–23)
CALCIUM: 9.1 mg/dL (ref 8.4–10.5)
CO2: 32 mmol/L (ref 19–32)
CREATININE: 1.08 mg/dL (ref 0.50–1.35)
Chloride: 98 mmol/L (ref 96–112)
GFR, EST AFRICAN AMERICAN: 78 mL/min — AB (ref >90)
GFR, EST NON AFRICAN AMERICAN: 68 mL/min — AB (ref >90)
Glucose, Bld: 103 mg/dL — ABNORMAL HIGH (ref 70–99)
Potassium: 4.7 mmol/L (ref 3.5–5.1)
Sodium: 138 mmol/L (ref 135–145)

## 2014-11-15 LAB — CREATININE, SERUM

## 2014-11-15 MED ORDER — HYDROCODONE-ACETAMINOPHEN 5-325 MG PO TABS: 1.0000 | ORAL_TABLET | Freq: Four times a day (QID) | ORAL | Status: DC | PRN

## 2014-11-15 MED ORDER — SULFAMETHOXAZOLE-TRIMETHOPRIM 800-160 MG PO TABS: 1.0000 | ORAL_TABLET | Freq: Two times a day (BID) | ORAL | Status: DC

## 2014-11-15 MED ORDER — CIPROFLOXACIN HCL 250 MG PO TABS
750.0000 mg | ORAL_TABLET | Freq: Two times a day (BID) | ORAL | Status: DC
  Administered 2014-11-15 – 2014-11-16 (×3): 750 mg via ORAL
  Filled 2014-11-15 (×3): qty 3

## 2014-11-15 MED ORDER — FUROSEMIDE 20 MG PO TABS: 20.0000 mg | ORAL_TABLET | Freq: Every day | ORAL | Status: DC

## 2014-11-15 MED ORDER — METOPROLOL TARTRATE 25 MG PO TABS: 25.0000 mg | ORAL_TABLET | Freq: Two times a day (BID) | ORAL | Status: DC

## 2014-11-15 NOTE — Clinical Social Work Note (Signed)
Pt's cousin, Bonita QuinLinda called CSW as she has been caring for pt's mother while he is in hospital. CSW spoke with pt and he gave permission to share plan with her. She is aware and very relieved that pt will be d/c today.   Derenda FennelKara Thong Feeny, [place] [phone]

## 2014-11-15 NOTE — Progress Notes (Signed)
Brought Advance Directives information and briefly discussed. Wanted to review materials.

## 2014-11-15 NOTE — Care Management Note (Addendum)
    Page 1 of 2   11/16/2014     1:46:47 PM CARE MANAGEMENT NOTE 11/16/2014  Patient:  Arthor CaptainHARRELSON,Daejon C   Account Number:  [id number]  Date Initiated:  11/15/2014  Documentation initiated by:  Kathyrn SheriffHILDRESS,JESSICA  Subjective/Objective Assessment:   Pt admitted for cellulitis. Pt lives at home with mother. Pt is independent at baseline. Pt drives and has a cane, walker and wheelchair if needed. Pt has no HH services prior to admission.     Action/Plan:   Pt plans to discharge home with self care. No CM needs identified. Will cont to follow.   Anticipated DC Date:  11/17/2014   Anticipated DC Plan:  HOME W HOME HEALTH SERVICES      DC Planning Services  CM consult      [facility]   Choice offered to / List presented to:  C-1 Patient        HH arranged  HH-1 RN      [facility]  [facility]   Status of service:  Completed, signed off Medicare Important Message given?  YES (If response is "NO", the following Medicare IM given date fields will be blank) Date Medicare IM given:  11/15/2014 Medicare IM given by:  Kathyrn SheriffHILDRESS,JESSICA Date Additional Medicare IM given:  11/16/2014 Additional Medicare IM given by:  Sharrie RothmanAMMY C Carmelita Amparo  Discharge Disposition:  SKILLED NURSING FACILITY  Per UR Regulation:  Reviewed for med. necessity/level of care/duration of stay  If discussed at Long Length of Stay Meetings, dates discussed:    Comments:  11/16/14 1345 Arlyss Queenammy Lucyle Alumbaugh, RN BSN CM [facility]. CSW to arrange discharge to facility.  11/15/2014 1330 Kathyrn SheriffJessica Childress, RN, MSN, CM [facility]. Alroy BailiffLinda Lothian, of [facility], per pt's choice, notified of referral and will  obtain pt information from chart. Pt notified [facility] has 48 hours to make first visit. 11/15/2014 1100 Kathyrn SheriffJessica Childress, RN, MSN, CM

## 2014-11-15 NOTE — Progress Notes (Signed)
ANTIBIOTIC CONSULT NOTE - follow up  Pharmacy Consult for Vancomycin and Cipro Indication: cellulitis  No Known Allergies  Patient Measurements: Height: 5\' 7"  (170.2 cm) (Simultaneous filing. User may not have seen previous data.) Weight: 160 lb (72.576 kg) (Simultaneous filing. User may not have seen previous data.) IBW/kg (Calculated) : 66.1  Vital Signs: Temp: 98.8 F (37.1 C) (02/29 0545) Temp Source: Oral (02/29 0545) BP: 145/94 mmHg (02/29 0545) Pulse Rate: 84 (02/29 0545) Intake/Output from previous day: 02/28 0701 - 02/29 0700 In: 1120 [P.O.:720; IV Piggyback:400] Out: 2700 [Urine:2700] Intake/Output from this shift: Total I/O In: 240 [P.O.:240] Out: 600 [Urine:600]  Labs:  Recent Labs  11/12/14 1845 11/13/14 0613 11/15/14 0521  WBC 6.5 6.4 9.4  HGB 12.9* 11.3* 12.8*  PLT 244 247 255  CREATININE 0.98 1.01 1.08   Estimated Creatinine Clearance: 59.5 mL/min (by C-G formula based on Cr of 1.08). No results for input(s): VANCOTROUGH, VANCOPEAK, VANCORANDOM, GENTTROUGH, GENTPEAK, GENTRANDOM, TOBRATROUGH, TOBRAPEAK, TOBRARND, AMIKACINPEAK, AMIKACINTROU, AMIKACIN in the last 72 hours.   Microbiology: No results found for this or any previous visit (from the past 720 hour(s)).  Medical History: Past Medical History  Diagnosis Date  . Medical history non-contributory    Anti-infectives    Start     Dose/Rate Route Frequency Ordered Stop   11/15/14 1030  ciprofloxacin (CIPRO) tablet 750 mg     750 mg Oral 2 times daily 11/15/14 1016     11/13/14 1000  vancomycin (VANCOCIN) IVPB 1000 mg/200 mL premix     1,000 mg 200 mL/hr over 60 Minutes Intravenous Every 12 hours 11/12/14 2306     11/12/14 2330  ciprofloxacin (CIPRO) IVPB 400 mg  Status:  Discontinued     400 mg 200 mL/hr over 60 Minutes Intravenous Every 12 hours 11/12/14 2306 11/15/14 1016   11/12/14 2015  vancomycin (VANCOCIN) IVPB 1000 mg/200 mL premix     1,000 mg 200 mL/hr over 60 Minutes Intravenous   Once 11/12/14 2001 11/12/14 2153     Assessment: 68yo male with cellulitis to LE.  Pt has good renal fxn.  Currently afebrile  Goal of Therapy:  Vancomycin trough level 10-15 mcg/ml  Eradicate infection.  Plan:  Switch Cipro to 750mg  PO q12hrs Vancomycin 1000mg  IV q12hrs Check trough tomorrow am Deescalate ABX when appropriate Monitor labs and cultures  Valrie HartHall, Ceasia Elwell A 11/15/2014,10:16 AM

## 2014-11-15 NOTE — Discharge Summary (Signed)
Physician Discharge Summary  Scott Matthews JXB:[id number] DOB: 1946/08/24 DOA: 11/12/2014  PCP: Scott SmilesFUSCO,LAWRENCE J., MD  Admit date: 11/12/2014 Discharge date: 11/15/2014  Time spent: 40 minutes  Recommendations for Outpatient Follow-up:  1. Follow up with primary care physician in 1-2 weeks for reevaluation of wounds 2. Home health nursing has been arranged  Discharge Diagnoses:  Active Problems:   Cellulitis, bilateral lower extremity wounds  Hypertension Venous stasis  Discharge Condition: improved  Diet recommendation: low salt  Filed Weights   11/12/14 1736  Weight: 72.576 kg (160 lb)    History of present illness:  This is a 69 year old gentleman who has a history of hypertension, presents to the emergency room with complaints of wounds on his anterior shins of both lower extremities which has been present for approximately 2 weeks. He reports that his wounds were progressively getting worse. He sustained an injury while cutting logs of wood. There was concern for underlying cellulitis, and therefore he was admitted for further treatments  Hospital Course:  Patient was started on vancomycin and ciprofloxacin. He underwent MRI imaging of both lower extremities which did not indicate any deep tissue infections. He was seen by both wound care and general surgery made recommendations regarding his wound care. No further debridement/operative management was recommended at this time. His erythema has significantly improved. He does have significant lower extremity edema which is likely related to chronic venous stasis. He was started on low-dose Lasix with improvement of his lower extremity edema. History maintained on once daily 6. Blood pressure was noted to be elevated throughout his hospital stay and has been started on Lopressor. The patient is in transition to oral antibiotics since been afebrile and has a normal WBC count. He'll be discharged home with home health  services.  Procedures:    Consultations:  General surgery  Wound care  Discharge Exam: Filed Vitals:   11/15/14 0545  BP: 145/94  Pulse: 84  Temp: 98.8 F (37.1 C)  Resp: 18    General: NAD Cardiovascular: S1, S2 RRR Respiratory: CTA B  Discharge Instructions   Discharge Instructions    Call MD for:  redness, tenderness, or signs of infection (pain, swelling, redness, odor or green/yellow discharge around incision site)    Complete by:  As directed      Call MD for:  severe uncontrolled pain    Complete by:  As directed      Call MD for:  temperature >100.4    Complete by:  As directed      Diet - low sodium heart healthy    Complete by:  As directed      Face-to-face encounter (required for Medicare/Medicaid patients)    Complete by:  As directed   I Scott Matthews certify that this patient is under my care and that I, or a nurse practitioner or physician's assistant working with me, had a face-to-face encounter that meets the physician face-to-face encounter requirements with this patient on 11/15/2014. The encounter with the patient was in whole, or in part for the following medical condition(s) which is the primary reason for home health care (List medical condition): lower extremity wounds, needs wound care  The encounter with the patient was in whole, or in part, for the following medical condition, which is the primary reason for home health care:  lower extremity wounds  I certify that, based on my findings, the following services are medically necessary home health services:  Nursing  Reason for Medically Necessary Home Health Services:  Skilled Nursing- Assessment and Observation of Wound Status  My clinical findings support the need for the above services:  Unable to leave home safely without assistance and/or assistive device  Further, I certify that my clinical findings support that this patient is homebound due to:  Unable to leave home safely without  assistance     Home Health    Complete by:  As directed   To provide the following care/treatments:  RN     Increase activity slowly    Complete by:  As directed           Current Discharge Medication List    START taking these medications   Details  furosemide (LASIX) 20 MG tablet Take 1 tablet (20 mg total) by mouth daily. Take one tab daily for 5 days then as needed for swelling Qty: 30 tablet, Refills: 0    HYDROcodone-acetaminophen (NORCO) 5-325 MG per tablet Take 1 tablet by mouth every 6 (six) hours as needed for moderate pain. Qty: 20 tablet, Refills: 0    metoprolol tartrate (LOPRESSOR) 25 MG tablet Take 1 tablet (25 mg total) by mouth 2 (two) times daily. Qty: 60 tablet, Refills: 0    sulfamethoxazole-trimethoprim (BACTRIM DS,SEPTRA DS) 800-160 MG per tablet Take 1 tablet by mouth 2 (two) times daily. Qty: 8 tablet, Refills: 0       No Known Allergies    The results of significant diagnostics from this hospitalization (including imaging, microbiology, ancillary and laboratory) are listed below for reference.    Significant Diagnostic Studies: Dg Tibia/fibula Left  11/12/2014   CLINICAL DATA:  Bilateral wounds to the mid anterior lower legs, Black and using. Patient was splitting wood 2 weeks ago and the wood hit the legs.  EXAM: LEFT TIBIA AND FIBULA - 2 VIEW  COMPARISON:  None.  FINDINGS: Left tibia and fibula appear intact. No evidence of acute fracture or subluxation. No focal bone lesion or bone destruction. Bone cortex and trabecular architecture appear intact. No radiopaque soft tissue foreign bodies.  IMPRESSION: Negative.   Electronically Signed   By: Burman Nieves M.D.   On: 11/12/2014 18:24   Dg Tibia/fibula Right  11/12/2014   CLINICAL DATA:  Bilateral wounds to mid anterior lower legs, black, bruising. Hit in shins with wood while chopping wood 2 weeks ago.  EXAM: RIGHT TIBIA AND FIBULA - 2 VIEW  COMPARISON:  None.  FINDINGS: Mild anterior soft tissue  swelling. No radiopaque foreign body. No underlying bony abnormality. No fracture, subluxation or dislocation.  IMPRESSION: No acute bony abnormality or radiopaque foreign body.   Electronically Signed   By: Charlett Nose M.D.   On: 11/12/2014 18:25   Mr Tibia Fibula Right Wo Contrast  11/15/2014   CLINICAL DATA:  Patient struck by wood 2 weeks ago with bilateral lower leg wounds and severe pain. Question infection.  EXAM: MRI OF LOWER RIGHT EXTREMITY WITHOUT CONTRAST  TECHNIQUE: Multiplanar, multisequence MR imaging of the right was performed. No intravenous contrast was administered.  COMPARISON:  Plain films right lower leg 11/12/2014.  FINDINGS: There is some subcutaneous edema about the right lower leg distally. No focal fluid collection is identified. There is no bone marrow signal abnormality to suggest osteomyelitis. All visualized musculature is intact and unremarkable in appearance. No foreign body is identified. No mass is seen. Limited visualization of the right knee and ankle is unremarkable.  IMPRESSION: Subcutaneous edema about the right lower leg most notable distally could be due to cellulitis or dependent  change. There is no abscess or evidence of osteomyelitis.  Negative for foreign body. Please note that MRI is not sensitive for detection of foreign bodies.   Electronically Signed   By: Drusilla Kanner M.D.   On: 11/15/2014 09:20   Mr Tibia Fibula Left Wo Contrast  11/15/2014   ADDENDUM REPORT: 11/15/2014 09:21  ADDENDUM: This addendum is given for the purpose of correcting the clinical data in the initially dictated report. The clinical does portion of the report should read as follows:  "Patient struck by wood 2 weeks ago with bilateral lower leg wounds and severe pain. Question infection."   Electronically Signed   By: Drusilla Kanner M.D.   On: 11/15/2014 09:21   11/15/2014   CLINICAL DATA:  Patient struck by a would 2 weeks ago with bilateral lower leg wounds in severe pain. Question  infection.  EXAM: MRI OF LOWER LEFT EXTREMITY WITHOUT CONTRAST  TECHNIQUE: Multiplanar, multisequence MR imaging of the left lower leg was performed. No intravenous contrast was administered.  COMPARISON:  Plain films left lower leg 11/12/2014.  FINDINGS: Subcutaneous edema is seen about the left lower leg but no focal fluid collection is identified. There is no bone marrow signal abnormality to suggest osteomyelitis. No mass is identified. There is fatty replacement of the medial gastrocnemius and peroneal musculature consistent with chronic atrophy. Limited visualization of the knee and ankle demonstrates no focal abnormality. No foreign body is identified.  IMPRESSION: Subcutaneous edema about the left lower leg could be due to dependent change or cellulitis. The examination is negative for abscess or osteomyelitis.  No foreign body is identified. Please note that MRI is not sensitive for the detection of foreign body.  Fatty atrophy of the medial gastrocnemius and peroneal musculature without focal lesion is most consistent with chronic change secondary to denervation.  Electronically Signed: By: Drusilla Kanner M.D. On: 11/15/2014 09:10    Microbiology: No results found for this or any previous visit (from the past 240 hour(s)).   Labs: Basic Metabolic Panel:  Recent Labs Lab 11/12/14 1845 11/13/14 0613 11/15/14 0521  NA 141 137 138  K 3.8 3.3* 4.7  CL 107 107 98  CO2 27 26 32  GLUCOSE 101* 107* 103*  BUN CREATININE 0.98 1.01 1.08  CALCIUM 8.9 8.2* 9.1   Liver Function Tests:  Recent Labs Lab 11/12/14 1845 11/13/14 0613  AST 27 20  ALT 22 17  ALKPHOS 63 49  BILITOT 0.6 0.6  PROT 7.9 6.1  ALBUMIN 3.9 3.1*   No results for input(s): LIPASE, AMYLASE in the last 168 hours. No results for input(s): AMMONIA in the last 168 hours. CBC:  Recent Labs Lab 11/12/14 1845 11/13/14 0613 11/15/14 0521  WBC 6.5 6.4 9.4  NEUTROABS 4.5 4.3  --   HGB 12.9* 11.3* 12.8*   HCT 40.4 35.6* 39.8  MCV 79.2 78.9 79.1  PLT 244 247 255   Cardiac Enzymes: No results for input(s): CKTOTAL, CKMB, CKMBINDEX, TROPONINI in the last 168 hours. BNP: BNP (last 3 results) No results for input(s): BNP in the last 8760 hours.  ProBNP (last 3 results) No results for input(s): PROBNP in the last 8760 hours.  CBG: No results for input(s): GLUCAP in the last 168 hours.     Signed:  Joell Usman  [facility] 11/15/2014, 1:31 PM

## 2014-11-15 NOTE — Evaluation (Signed)
Physical Therapy Evaluation Patient Details Name: Scott Matthews MRN: [id number] DOB: 01/06/1946 Today's Date: 11/15/2014   History of Present Illness  HPI: Scott Matthews is a 69 y.o. 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 Patient states he sustained injury while cutting logs of wood. Patient states he has mild discharge from the wound. On exam patient has skin excoriation with erythema and swelling of the both lower extremities. Denies any fever or chills. In the ER in addition patient is found to have very elevated blood pressure. Patient states he was told he had elevated blood pressure last year but he did not follow-up. Patient otherwise denies any chest pain shortness of breath.   Clinical Impression   Pt was seen at the request of MD, RN as pt has been discharged and is unable to ambulate.  He was seen at bedside and he was taking tissues and applying them to the open wounds on his calves.  On inspection of the LEs it was observed that he could not extend his knees past -35 degrees on the right, 20 degrees on the left.  He stated that when he tried to straighten his knee it pulled against his wound.  The skin on his feet and calves was very dry so I applied Vaseline over all of the intact skin.  In order to soften the crusty wound areas, Iodoform gauze was applied followed by Kerlix.(  I discussed this with RN prior to this procedure.)  This was done in order to achieve improved knee extension and it worked.  He now has full knee extension bilaterally and states that his legs feel much better.  Unfortunately, he was unable to stand and functionally walk.  In sitting, he felt that he could not flex his knees nor could he place his heels on the floor.  He was only able to take 2  steps forward and backward with extreme effort.  His gait problems will not allow him to function at home, so I am recommending SNF at d/c and pt is quite agreeable to this.    Follow Up Recommendations SNF    Equipment Recommendations  None recommended by PT    Recommendations for Other Services   non    Precautions / Restrictions Precautions Precautions: Fall Restrictions Weight Bearing Restrictions: No      Mobility  Bed Mobility Overal bed mobility: Modified Independent                Transfers Overall transfer level: Needs assistance   Transfers: Sit to/from Stand Sit to Stand: Mod assist;From elevated surface         General transfer comment: pt states that it is difficult for him to flex his knees and place feet flat on floor...he has full ROM so this should not be a problem but he insists that these postitions are difficult for him...with a walker he is only able to take 2 steps forward and backward, extremely slow and labored  Ambulation/Gait Ambulation/Gait assistance:  (not functionally able to amblate)              Stairs            Wheelchair Mobility    Modified Rankin (Stroke Patients Only)       Balance Overall  balance assessment: No apparent balance deficits (not formally assessed)                                           Pertinent Vitals/Pain Pain Assessment: No/denies pain (premedicated)    Home Living Family/patient expects to be discharged to:: Skilled nursing facility Living Arrangements: Parent                    Prior Function Level of Independence: Independent               Hand Dominance        Extremity/Trunk Assessment               Lower Extremity Assessment: Overall WFL for tasks assessed         Communication   Communication: No difficulties  Cognition Arousal/Alertness: Awake/alert Behavior During Therapy: WFL for tasks assessed/performed Overall  Cognitive Status: Within Functional Limits for tasks assessed                      General Comments      Exercises        Assessment/Plan    PT Assessment All further PT needs can be met in the next venue of care  PT Diagnosis Difficulty walking   PT Problem List Decreased mobility  PT Treatment Interventions     PT Goals (Current goals can be found in the Care Plan section) Acute Rehab PT Goals PT Goal Formulation: All assessment and education complete, DC therapy    Frequency     Barriers to discharge  none      Co-evaluation               End of Session Equipment Utilized During Treatment: Gait belt Activity Tolerance: Patient tolerated treatment well Patient left: in bed;with call bell/phone within reach Nurse Communication: Mobility status         Time: 5909-3112 PT Time Calculation (min) (ACUTE ONLY): 44 min   Charges:   PT Evaluation $Initial PT Evaluation Tier I: 1 Procedure     PT G CodesDemetrios Isaacs L 11/15/2014, 4:11 PM

## 2014-11-16 ENCOUNTER — Inpatient Hospital Stay
Admission: RE | Admit: 2014-11-16 | Discharge: 2014-12-18 | Disposition: A | Discharge status: Home / Self Care | Payer: Commercial Managed Care - HMO | Source: Ambulatory Visit | Attending: Internal Medicine | Admitting: Internal Medicine

## 2014-11-16 DIAGNOSIS — L03119 Cellulitis of unspecified part of limb: Secondary | ICD-10-CM | POA: Diagnosis not present

## 2014-11-16 DIAGNOSIS — R609 Edema, unspecified: Secondary | ICD-10-CM | POA: Diagnosis not present

## 2014-11-16 DIAGNOSIS — S81809D Unspecified open wound, unspecified lower leg, subsequent encounter: Secondary | ICD-10-CM | POA: Diagnosis not present

## 2014-11-16 DIAGNOSIS — D649 Anemia, unspecified: Secondary | ICD-10-CM | POA: Diagnosis not present

## 2014-11-16 DIAGNOSIS — M6281 Muscle weakness (generalized): Secondary | ICD-10-CM | POA: Diagnosis not present

## 2014-11-16 DIAGNOSIS — R278 Other lack of coordination: Secondary | ICD-10-CM | POA: Diagnosis not present

## 2014-11-16 DIAGNOSIS — R030 Elevated blood-pressure reading, without diagnosis of hypertension: Secondary | ICD-10-CM | POA: Diagnosis not present

## 2014-11-16 DIAGNOSIS — I10 Essential (primary) hypertension: Secondary | ICD-10-CM | POA: Diagnosis not present

## 2014-11-16 DIAGNOSIS — R05 Cough: Secondary | ICD-10-CM | POA: Diagnosis not present

## 2014-11-16 DIAGNOSIS — E876 Hypokalemia: Secondary | ICD-10-CM | POA: Diagnosis not present

## 2014-11-16 DIAGNOSIS — R262 Difficulty in walking, not elsewhere classified: Secondary | ICD-10-CM | POA: Diagnosis not present

## 2014-11-16 DIAGNOSIS — I878 Other specified disorders of veins: Secondary | ICD-10-CM | POA: Diagnosis not present

## 2014-11-16 DIAGNOSIS — L0390 Cellulitis, unspecified: Secondary | ICD-10-CM | POA: Diagnosis not present

## 2014-11-16 NOTE — Progress Notes (Signed)
Patient discharged to [facility],report called,[facility], vital signs stable. Accompanied by staff to an awaiting floor.

## 2014-11-16 NOTE — Clinical Social Work Placement (Signed)
Clinical Social Work Department CLINICAL SOCIAL WORK PLACEMENT NOTE 11/16/2014  Patient:  Scott Matthews,Scott Matthews  Account Number:  [id number] Admit date:  11/12/2014  Clinical Social Worker:  Derenda FennelKARA Madden Piazza, LCSW  Date/time:  11/16/2014 08:52 AM  Clinical Social Work is seeking post-discharge placement for this patient at the following level of care:   SKILLED NURSING   (*CSW will update this form in Epic as items are completed)   11/16/2014  Patient/family provided with [facility] Department of Clinical Social Work's list of facilities offering this level of care within the geographic area requested by the patient (or if unable, by the patient's family).  11/16/2014  Patient/family informed of their freedom to choose among providers that offer the needed level of care, that participate in Medicare, Medicaid or managed care program needed by the patient, have an available bed and are willing to accept the patient.  11/16/2014  Patient/family informed of [facility]' ownership interest in [facility], as well as of the fact that they are under no obligation to receive care at this facility.  PASARR submitted to EDS on 11/16/2014 PASARR number received on 11/16/2014  FL2 transmitted to all facilities in geographic area requested by pt/family on  11/16/2014 FL2 transmitted to all facilities within larger geographic area on   Patient informed that his/her managed care company has contracts with or will negotiate with  certain facilities, including the following:     Patient/family informed of bed offers received:   Patient chooses bed at  Physician recommends and patient chooses bed at    Patient to be transferred to  on   Patient to be transferred to facility by  Patient and family notified of transfer on  Name of family member notified:    The following physician request were entered in Epic:   Additional Comments:  Derenda FennelKara Braylin Formby, LCSW [phone]

## 2014-11-16 NOTE — Clinical Social Work Psychosocial (Signed)
Clinical Social Work Department BRIEF PSYCHOSOCIAL ASSESSMENT 11/16/2014  Patient:  Scott Matthews, Scott Matthews     Account Number:  [id number]     [place] date:  11/12/2014  Clinical Social Worker:  Wyatt Haste  Date/Time:  11/16/2014 08:55 AM  Referred by:  CSW  Date Referred:  11/16/2014 Referred for  SNF Placement   Other Referral:   Interview type:  Patient Other interview type:    PSYCHOSOCIAL DATA Living Status:  FAMILY Admitted from facility:   Level of care:   Primary support name:  Scott Matthews" Primary support relationship to patient:  FAMILY Degree of support available:    CURRENT CONCERNS Current Concerns  Post-Acute Placement   Other Concerns:    SOCIAL WORK ASSESSMENT / PLAN CSW met with pt at bedside. Pt assessed by PT late yesterday and recommendation is for SNF. Pt appeared somewhat relieved by this as he seemed concerned about going home where he is the caregiver for his 48 year old mother. Family has stepped in for short term to care for her, but they are also considering placement while pt is at [facility] At baseline, pt is independent. He has good family support, particularly from his cousin, Scott Matthews." CSW discussed SNF process and pt is agreeable. SNF list provided. He is aware of [facility] authorization process. Pt requests [facility] or [facility]. Pt is concerned about his mother, but also understands that he is unable to provide care for anyone at this point.   Assessment/plan status:  Psychosocial Support/Ongoing Assessment of Needs Other assessment/ plan:   Information/referral to community resources:   SNF list    PATIENT'S/FAMILY'S RESPONSE TO PLAN OF CARE: CSW will initiate bed search and follow up with pt.      Benay Pike, [place]

## 2014-11-16 NOTE — Clinical Social Work Placement (Signed)
Clinical Social Work Department CLINICAL SOCIAL WORK PLACEMENT NOTE 11/16/2014  Patient:  Scott Matthews,Scott Matthews  Account Number:  [id number] Admit date:  11/12/2014  Clinical Social Worker:  Derenda FennelKARA Flo Berroa, LCSW  Date/time:  11/16/2014 08:52 AM  Clinical Social Work is seeking post-discharge placement for this patient at the following level of care:   SKILLED NURSING   (*CSW will update this form in Epic as items are completed)   11/16/2014  Patient/family provided with [facility] Department of Clinical Social Work's list of facilities offering this level of care within the geographic area requested by the patient (or if unable, by the patient's family).  11/16/2014  Patient/family informed of their freedom to choose among providers that offer the needed level of care, that participate in Medicare, Medicaid or managed care program needed by the patient, have an available bed and are willing to accept the patient.  11/16/2014  Patient/family informed of [facility]' ownership interest in [facility], as well as of the fact that they are under no obligation to receive care at this facility.  PASARR submitted to EDS on 11/16/2014 PASARR number received on 11/16/2014  FL2 transmitted to all facilities in geographic area requested by pt/family on  11/16/2014 FL2 transmitted to all facilities within larger geographic area on   Patient informed that his/her managed care company has contracts with or will negotiate with  certain facilities, including the following:     Patient/family informed of bed offers received:  11/16/2014 Patient [facility] bed [facility] Physician recommends and patient chooses bed at    Patient [facility] be [facility] on  11/16/2014 Patient to be transferred to facility by RN Patient and family notified of transfer on 11/16/2014 Name of family member notified:  family in room  The following physician request were entered in  Epic:   Additional Comments:  Derenda FennelKara Forestine Macho, LCSW [phone]

## 2014-11-16 NOTE — Progress Notes (Signed)
Patient removed new dressing to bilateral lower extremities to to burning sensation to legs. Pain medication was given. Patient states that the dressing makes the pain "stick around.". Patient refused redressing at this time. Will continue to monitor.

## 2014-11-16 NOTE — Clinical Social Work Note (Signed)
[facility] Facility notified and has received authorization. CSW attempted to call [facility]ia [facility], but no voicemail set up. Several family members in room and pt gave permission for CSW to share d/c plans. Pt to transfer with RN.  Derenda FennelKara Vane Yapp, [place] [phone]

## 2014-11-17 ENCOUNTER — Non-Acute Institutional Stay (SKILLED_NURSING_FACILITY): Payer: Commercial Managed Care - HMO | Admitting: Internal Medicine

## 2014-11-17 DIAGNOSIS — I10 Essential (primary) hypertension: Secondary | ICD-10-CM | POA: Diagnosis not present

## 2014-11-17 DIAGNOSIS — S81809D Unspecified open wound, unspecified lower leg, subsequent encounter: Secondary | ICD-10-CM

## 2014-11-17 DIAGNOSIS — L03119 Cellulitis of unspecified part of limb: Secondary | ICD-10-CM

## 2014-11-17 NOTE — Progress Notes (Signed)
UR chart review completed.  

## 2014-11-17 NOTE — Progress Notes (Signed)
Patient was scheduled to be discharged home yesterday, but was unable to ambulate due to generalized weakness. He was seen by physical therapy and recommendations were for skilled nursing facility placement.   Exam is unchanged from yesterday and he does not have any new complaints.  Will plan on discharging patient to SNF with the same plan of care. Please refer to discharge summary from yesterday for further details.  MEMON,JEHANZEB

## 2014-11-22 ENCOUNTER — Other Ambulatory Visit: Payer: Self-pay | Admitting: *Deleted

## 2014-11-22 MED ORDER — HYDROCODONE-ACETAMINOPHEN 5-325 MG PO TABS: 1.0000 | ORAL_TABLET | Freq: Four times a day (QID) | ORAL | Status: DC | PRN

## 2014-11-22 NOTE — Progress Notes (Addendum)
Patient ID: Scott Matthews, male   DOB: Oct 11, 1945, 69 y.o.   MRN: [id number]                 HISTORY & PHYSICAL  DATE:  11/17/2014            FACILITY: [facility]       LEVEL OF CARE:   SNF   CHIEF COMPLAINT:  Admission to SNF, post stay at [facility], 11/12/2014 through 11/15/2014.    HISTORY OF PRESENT ILLNESS:  This is a 69 year-old man who lives at home with his mother in [place].  He is independent.    He states that two weeks before his admission, he traumatized his bilateral anterior shins of both lower extremities while doing wood work.  He had wounds that were present and gradually getting worse.  There was blistering and concerns for underlying cellulitis and, therefore, he was admitted.  In spite of the look of his legs, the patient really denies any prior venous stasis changes although I see that appeared on his discharge diagnoses.    In the hospital, he was treated with vancomycin and ciprofloxacin.  He underwent an MRI of both lower extremities which did not show any deep soft tissue infection, nor osteomyelitis, nor any fluid collection.  He was seen by Wound Care and General Surgery with no further debridement.  Operative management was recommended.  His erythema improved significantly.  He was started on low-dose Lasix due to dependent edema.    LABORATORY DATA:  Reviewing his lab work showed an essentially normal comprehensive metabolic panel.    CBC was normal.  He did not have an elevated white count.  Differential count was normal.      His B12 level was 415.    C-reactive protein was 1.8.    Sedimentation rate was 30.    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   The exact diagnosis is not immediately clear.    Venous stasis.    Essential hypertension.    History of a fractured distal phalanx.    CURRENT MEDICATIONS:  Discharge medications include:        Lasix 20 mg a day.    Norco 5/325, 1 tablet q.6 hours p.r.n.        Metoprolol 25 b.i.d.        Septra DS 1 tablet two times daily for four days.       SOCIAL HISTORY:                   HOUSING:  The patient lives with his mother.     FUNCTIONAL STATUS:  He is independent with ADLs and IADLs.   TOBACCO USE:  He is a non-smoker.     FAMILY HISTORY:     MOTHER:  Hypertension.    FATHER:  Hypertension.      REVIEW OF SYSTEMS:            GENERAL:  The patient states he feels well.     CHEST/RESPIRATORY:  No cough.  No sputum.          CARDIAC:  No chest pain.    GI:  Manages his own colostomy.   I will need to clarify what this surgery was initially done for.  SKIN:  Extremities:  He denies any discoloration in his legs chronically, although I have seen patients where this is just not recognized.   It would seem quite likely that he has venous stasis.  He is not a diabetic, does not have claudication.     PHYSICAL EXAMINATION:   GENERAL APPEARANCE:  The patient looks well.  He is bright, alert.   SKIN:   INSPECTION:   Over the anterior legs bilaterally and symmetrically are his original wounds.   This is covered with black eschar, which is probably going to need to be debrided at some point.  There is discoloration of his legs around the wounds bilaterally and symmetrically.  This does look like venous stasis.   CHEST/RESPIRATORY:  Clear air entry bilaterally.     CARDIOVASCULAR:   CARDIAC:  Heart sounds are normal.   GASTROINTESTINAL:   ABDOMEN:  Colostomy in the left lower quadrant.  No masses.    LIVER/SPLEEN/KIDNEY:   No liver, no spleen.  No tenderness.    VASCULAR:   ARTERIAL:  His peripheral pulses are easily palpable, both his dorsalis pedis.     ASSESSMENT/PLAN:                                        Traumatic areas to both legs, probably in the setting of venous stasis.  Although bilateral cellulitis is unusual, the history here would suggest that the patient was at  risk (bilateral trauma).  He is completing antibiotics and no further antibiotics are required.  There is black eschar surrounding the actual wound areas.  I would suggest Santyl for this, mixed with hydrogel under an occlusive wrap.  This may make it easier to mechanically debride this where we can add definitive treatment for the wounds at this point.    History of colostomy.  I have discussed this with the patient.  He stated that he had a perforation in his colon eight years ago of uncertain etiology.  He did not have a malignancy.  He was told that he could be reconnected.  He has just never done this.  There is nothing in [place] link I can find about this  Hypertension.  He is on metoprolol.

## 2014-12-01 ENCOUNTER — Non-Acute Institutional Stay: Payer: Commercial Managed Care - HMO | Admitting: Internal Medicine

## 2014-12-01 DIAGNOSIS — R609 Edema, unspecified: Secondary | ICD-10-CM

## 2014-12-01 DIAGNOSIS — I10 Essential (primary) hypertension: Secondary | ICD-10-CM

## 2014-12-01 DIAGNOSIS — I878 Other specified disorders of veins: Secondary | ICD-10-CM

## 2014-12-01 NOTE — Progress Notes (Signed)
Patient ID: Scott Matthews, male   DOB: [birth year], 69 y.o.   MRN: [id number]   this is an acute visit   level care skilled.  Facility [facility].  Chief complaint-acute visit follow-up leg edema-question numbness.  History of present illness.  Patient is a very pleasant 69 year old male here for rehabilitation after traumatizing his bilateral anterior shins with doing woodwork-he sustained the injuries apparently while doing woodwork the wounds began to blister and there were concerns for cellulitis and he was admitted to the hospital.  He also has a history of venous stasis it appears.  He was treated in the hospital aggressively with vancomycin and ciprofloxacin and discharged on Septra DS-he has completed his antibiotics.--  MRI did not show any osteomyelitis or deep soft tissue infection or fluid collection.  Operative management was recommended.  Apparently these have improved they are followed by wound care currently dry dressing applied.--  He is complaining of some leg discomfort and numbness with increased edema at times.  According to nursing staff this is been somewhat of a chronic issue since his admission.  I have reviewed his weights appears to be relatively stable with his admission weight weight 2 days ago was 147.8-on admission in early March it was 145.8-appear he had lost a bit of weight down  3 pounds and now has gained it back.  He does not complain of any shortness of breath or chest pain.  He does receive Norco as needed for pain.  He also has a history of hypertension apparently this was significantly elevated in the hospital admission-and moderated he was put on Lopressor-blood pressures here. Variable ranging from the 120s-150 systolically at this point will have to be monitored.  He does not complain of any dizziness headache or chest pain.  Family medical social history as been reviewed and most recently progress note on  11/17/2014.  Medications have been reviewed per [facility] they are fairly minimal   Lasix 20 mg as needed daily.  5325 mg one tablet every 6 hours when necessary.  Lopressor 25 mg twice a day.  He has completed his Septra.  Review of systems.  In general does not complain of any fever or chills appears to be doing well.  Skin does not complain of rashes or itching.  Head ears eyes nose mouth and throat-does not complain of any sore throat or visual changes.  Respiratory no complaints of shortness of breath or cough.  Cardiac does not complain of chest pain appears to have some chronic venous stasis changes.  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  Musculoskeletal is not really complaining of significant joint pain again does say his legs feel like the R burning and uncomfortable at times he thinks this may be caused by the edema.  No logic does not complain of dizziness or headache again has some numbness of his legs he attributes possibly to edema and somewhat chronic it appears.  Psych does not complain of depression or anxiety.  Physical exam.  He is afebrile pulse of 70 respirations 20 blood pressures variable 158/97 160/94 137/92 123/67 is the range most recently.  In general this is a very pleasant elderly male in no distress he is at with [place].  His skin is warm and dry he does have dressing applied over his shins bilaterally this is followed closely by wound care apparently is stable he  is off his antibiotic.  Heart is regular rate and rhythm without murmur gallop or rub.  His chest is clear to auscultation there is no labored breathing--I would say he has trace bordering on one plus  lower extremity edema -this appears to be likely venous stasis.--Pedal pulses are intact bilaterally--skin is appropriate warmth to touch  Abdomen he does have colostomy bag in  place this has a moderate amount of formed stool abdomen is soft nontender with active bowel sounds.  Muscle skeletal he is able to ambulate moves his extremities at baseline.  Neurologic is grossly intact he has touch sensation of his lower extremities bilaterally.--Babinski is somewhat equivocal however he was anticipating the test-  Psych  he is alert and oriented 3 pleasant and appropriate  Labs.  11/15/2014.  WBC 9.4 hemoglobin 12.8 platelets 225.  Sodium 138 potassium 4.7 CO2 32 BUN 17 creatinine 1.08.  Of note glucoses on metabolic panel have been between 100-1 10  11/13/2014.  Albumin 3.1 otherwise liver function tests within normal limits   Assessment and plan.  #1-lower extremity edema with history of venous stasis changes-clinically he appears stable might be gaining a bit of weight although this is somewhat variable-will write an order to weigh patient tomorrow and notify provider of any weight gain-also would like to get a metabolic panel for follow-up of his renal issues potassium level-consider putting him on Lasix somewhat routinely if labs are stable and he has gained significant weight--- it appears his blood pressure can tolerate this.  Also will update a TSH CBC CMP and hemoglobin A1c with history of numbness although this could be caused by the edema neurologically appears to be intact  Hypertension-as noted above has variable systolics on Lopressor-at this point continue to monitor --consider initiation of low-dose Lasix at some point as noted above again will await laboratory results weight results.  [id number].

## 2014-12-02 ENCOUNTER — Other Ambulatory Visit (HOSPITAL_COMMUNITY)
Admission: AD | Admit: 2014-12-02 | Discharge: 2014-12-02 | Disposition: A | Discharge status: Home / Self Care | Payer: Commercial Managed Care - HMO | Source: Skilled Nursing Facility | Attending: Internal Medicine | Admitting: Internal Medicine

## 2014-12-02 LAB — CBC WITH DIFFERENTIAL/PLATELET
Basophils Absolute: 0.1 10*3/uL (ref 0.0–0.1)
Basophils Relative: 1 % (ref 0–1)
Eosinophils Absolute: 0.2 10*3/uL (ref 0.0–0.7)
Eosinophils Relative: 3 % (ref 0–5)
HCT: 37.8 % — ABNORMAL LOW (ref 39.0–52.0)
Hemoglobin: 12.2 g/dL — ABNORMAL LOW (ref 13.0–17.0)
Lymphocytes Relative: 19 % (ref 12–46)
Lymphs Abs: 1.3 10*3/uL (ref 0.7–4.0)
MCH: 25.1 pg — AB (ref 26.0–34.0)
MCHC: 32.3 g/dL (ref 30.0–36.0)
MCV: 77.8 fL — ABNORMAL LOW (ref 78.0–100.0)
MONO ABS: 0.5 10*3/uL (ref 0.1–1.0)
Monocytes Relative: 7 % (ref 3–12)
NEUTROS ABS: 4.8 10*3/uL (ref 1.7–7.7)
Neutrophils Relative %: 70 % (ref 43–77)
Platelets: 241 10*3/uL (ref 150–400)
RBC: 4.86 MIL/uL (ref 4.22–5.81)
RDW: 14.7 % (ref 11.5–15.5)
WBC: 6.9 10*3/uL (ref 4.0–10.5)

## 2014-12-02 LAB — COMPREHENSIVE METABOLIC PANEL
ALK PHOS: 57 U/L (ref 39–117)
ALT: 16 U/L (ref 0–53)
ANION GAP: 7 (ref 5–15)
AST: 20 U/L (ref 0–37)
Albumin: 3.4 g/dL — ABNORMAL LOW (ref 3.5–5.2)
BILIRUBIN TOTAL: 0.4 mg/dL (ref 0.3–1.2)
BUN: 18 mg/dL (ref 6–23)
CHLORIDE: 107 mmol/L (ref 96–112)
CO2: 27 mmol/L (ref 19–32)
Calcium: 9 mg/dL (ref 8.4–10.5)
Creatinine, Ser: 0.92 mg/dL (ref 0.50–1.35)
GFR calc non Af Amer: 83 mL/min — ABNORMAL LOW (ref >90)
Glucose, Bld: 98 mg/dL (ref 70–99)
Potassium: 3.9 mmol/L (ref 3.5–5.1)
Sodium: 141 mmol/L (ref 135–145)
Total Protein: 6.9 g/dL (ref 6.0–8.3)

## 2014-12-02 LAB — TSH: TSH: 0.84 u[IU]/mL (ref 0.350–4.500)

## 2014-12-03 LAB — HEMOGLOBIN A1C
Hgb A1c MFr Bld: 6.6 % — ABNORMAL HIGH (ref 4.8–5.6)
MEAN PLASMA GLUCOSE: 143 mg/dL

## 2014-12-08 ENCOUNTER — Non-Acute Institutional Stay (SKILLED_NURSING_FACILITY): Payer: Commercial Managed Care - HMO | Admitting: Internal Medicine

## 2014-12-08 ENCOUNTER — Encounter: Payer: Self-pay | Admitting: Internal Medicine

## 2014-12-08 DIAGNOSIS — R05 Cough: Secondary | ICD-10-CM | POA: Diagnosis not present

## 2014-12-08 DIAGNOSIS — L03119 Cellulitis of unspecified part of limb: Secondary | ICD-10-CM | POA: Diagnosis not present

## 2014-12-08 DIAGNOSIS — R059 Cough, unspecified: Secondary | ICD-10-CM

## 2014-12-08 NOTE — Progress Notes (Signed)
Patient ID: Scott Matthews, male   DOB: Aug 29, 1946, 69 y.o.   MRN: [id number]   This is an acute visit   level care skilled.  Facility [facility].  Chief complaint acute visit follow-up leg wounds-cough.  History of present illness.  Patient is a very pleasant 69 year old male here for rehabilitation after traumatizing his bilateral anterior shins with doing woodwork-he sustained the injuries apparently while doing woodwork the wounds began to blister and there were concerns for cellulitis and he was admitted to the hospital.  He also has a history of venous stasis it appears.  He was treated in the hospital aggressively with vancomycin and ciprofloxacin and discharged on Septra DS-he has completed his antibiotics.--  MRI did not show any osteomyelitis or deep soft tissue infection or fluid collection.    Apparently these have improved they are followed by wound care currently dry dressing applied.-  However wound care nurse today noted possibly a small amount of increased erythema and warmth of the right lower leg shin area--  He is not complaining of any increased leg discomfort fever or chills.  He does state he feels he is getting a cold with a cough productive at times of some yellowish phlegm.  .   .  Family medical social history as been reviewed including progress note on 11/17/2014  Medications have been reviewed per [facility] they are fairly minimal   Lasix 20 mg as needed daily.  Hydrocodone 01/18/2024 mg one tablet every 6 hours when necessary.  Lopressor 25 mg twice a day.  He has completed his Septra.  Review of systems.  In general does not complain of any fever or chills appears to be doing well.  Skin does not complain of rashes or itching.--Nursing has some mild concerns about right leg wound thinking it may have some slightly increased warmth and erythema  Head ears eyes nose mouth and throat-does not complain of any sore throat or visual  changes.  Respiratory no complaints of shortness of breath --does have a cough --thinks he is getting a cold.  Cardiac does not complain of chest pain appears to have some chronic venous stasis changes edema actually appears to be baseline this evening.  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  Musculoskeletal is not really complaining of significant joint pain again does say his legs feel like the R burning and uncomfortable at times he thinks this may be caused by the edema.  No logic does not complain of dizziness or headache  He does not complain of leg numbness this evening  Psych does not complain of depression or anxiety.  Physical exam.   Temperature 98.0 pulse 90 respirations 20 blood pressure 133/78 most recent weight 150 there is a lot of variability here at appears he ranges from the low 140s to high 140s this will need to be monitored.  In general this is a very pleasant elderly male in no distress.  His skin is warm and dry --wound beds on bilateral shin wounds appear to be clean there is a some sloth.  The wound on the right leg is larger than the left this is not new--assessed with the wound care nurse who feels there is slightly increased erythema on the right wound superior aspect and slightly increased warmth-erythema is quite minimal but per wound care is a change .  Heart is regular rate and rhythm  without murmur gallop or rub.  His chest is clear to auscultation there is no labored breathing--I would say he has trace bordering on one plus  lower extremity edema -this appears to be likely venous stasis.--Pedal pulses are intact bilaterally--  Abdomen he does have colostomy bag in place this has a moderate amount of formed stool abdomen is soft nontender with active bowel sounds.  Muscle skeletal he is able to ambulate moves his extremities at  baseline.  Neurologic is grossly intact he has touch sensation of his lower extremities bilaterally.--Babinski is somewhat equivocal however he was anticipating the test-  Psych  he is alert and oriented 3 pleasant and appropriate  Labs.  12/02/2014.  Sodium 141 potassium 3.9 BUN 18 creatinine 0.92.  Liver function tests within normal limits except albumin of 3.4.  WBC 6.9 hemoglobin 12.2 platelets 241.    11/15/2014.  WBC 9.4 hemoglobin 12.8 platelets 225.  Sodium 138 potassium 4.7 CO2 32 BUN 17 creatinine 1.08.  Of note glucoses on metabolic panel have been between 100-1 10  11/13/2014.  Albumin 3.1 otherwise liver function tests within normal limits   Assessment and plan hours keep an eye on this  #1-bilateral leg wounds possibly some small developing cellulitis-will restart Bactrim DS twice a day for 10 days and have this monitored closely by wound care they are aware of the situation and actually called me in to look at this day will be watching this closely area of erythema has been marked with a magic marker.  #2-cough-physical exam was quite benign-will start Mucinex 600 mg twice a day and monitor with vital signs pulse ox every shift for 72 to keep an eye on this   #3-history of edema this appears fairly chronic will have staff monitor weights obtain weight tomorrow notify provider of any weight gain.  [id number] .    [id number].

## 2014-12-17 ENCOUNTER — Non-Acute Institutional Stay (SKILLED_NURSING_FACILITY): Payer: Commercial Managed Care - HMO | Admitting: Internal Medicine

## 2014-12-17 DIAGNOSIS — L03119 Cellulitis of unspecified part of limb: Secondary | ICD-10-CM | POA: Diagnosis not present

## 2014-12-17 DIAGNOSIS — I878 Other specified disorders of veins: Secondary | ICD-10-CM

## 2014-12-17 DIAGNOSIS — I10 Essential (primary) hypertension: Secondary | ICD-10-CM | POA: Diagnosis not present

## 2014-12-18 ENCOUNTER — Encounter (HOSPITAL_COMMUNITY)
Admission: RE | Admit: 2014-12-18 | Discharge: 2014-12-18 | Disposition: A | Discharge status: Home / Self Care | Payer: PRIVATE HEALTH INSURANCE | Source: Ambulatory Visit | Attending: Internal Medicine | Admitting: Internal Medicine

## 2014-12-18 LAB — CBC
HCT: 35.7 % — ABNORMAL LOW (ref 39.0–52.0)
Hemoglobin: 11.3 g/dL — ABNORMAL LOW (ref 13.0–17.0)
MCH: 24.6 pg — AB (ref 26.0–34.0)
MCHC: 31.7 g/dL (ref 30.0–36.0)
MCV: 77.8 fL — AB (ref 78.0–100.0)
PLATELETS: 233 10*3/uL (ref 150–400)
RBC: 4.59 MIL/uL (ref 4.22–5.81)
RDW: 15.3 % (ref 11.5–15.5)
WBC: 5.7 10*3/uL (ref 4.0–10.5)

## 2014-12-18 LAB — BASIC METABOLIC PANEL
ANION GAP: 8 (ref 5–15)
BUN: 19 mg/dL (ref 6–23)
CALCIUM: 8.8 mg/dL (ref 8.4–10.5)
CHLORIDE: 104 mmol/L (ref 96–112)
CO2: 26 mmol/L (ref 19–32)
CREATININE: 1.14 mg/dL (ref 0.50–1.35)
GFR calc Af Amer: 73 mL/min — ABNORMAL LOW (ref >90)
GFR calc non Af Amer: 63 mL/min — ABNORMAL LOW (ref >90)
GLUCOSE: 88 mg/dL (ref 70–99)
Potassium: 4.1 mmol/L (ref 3.5–5.1)
SODIUM: 138 mmol/L (ref 135–145)

## 2014-12-19 ENCOUNTER — Encounter: Payer: Self-pay | Admitting: Internal Medicine

## 2014-12-19 NOTE — Progress Notes (Signed)
Patient ID: Scott Matthews, male   DOB: Dec 08, 1945, 69 y.o.   MRN: [id number]  ,     this is a discharge note   level care skilled.  Facility [facility].  Date is 12/17/2014  Chief complaint-discharge note  History of present illness.  Patient is a very pleasant 69 year old male here for rehabilitation after traumatizing his bilateral anterior shins with doing woodwork-he sustained the injuries apparently while doing woodwork the wounds began to blister and there were concerns for cellulitis and he was admitted to the hospital.  He also has a history of venous stasis it appears.  He was treated in the hospital aggressively with vancomycin and ciprofloxacin and discharged on Septra DS-he did complete his antibiotics-however there was some increased erythema that reoccurred on his right leg and he did receive another course of Bactrim and this apparently resolved.  However I did take a look at him several days ago and appeared the was returning returned to a small extent-he has been restarted on Bactrim again this appears to be receding-  MRI did not show any osteomyelitis or deep soft tissue infection or fluid collection. During his hospitalization  . Patient does have some chronic venous stasis and edema this appears to be stable he is on low-dose Lasixprn -- I don't believe he really has  been taking this-- weights appear to be somewhat variable but he has not had any dramatic weight gain most recent weight 149.6 at appears admission weight was 145.7 he has been as high as 147.8 a couple weeks ago again there is variability.    He does receive Norco as needed for pain.  He also has a history of hypertension apparently this was significantly elevated in the hospital admission-and moderated he was put on Lopressor-blood pressures here show stabilization most recently 125/79-136/92.  He did complain of cough earlier in his stay as well this appears to have responded to a course  of Mucinex he does not complain of any cough or shortness of breath today   He does not complain of any dizziness headache or chest pain.  Family medical social history  been reviewed per recent progress notes as well as admission note on 11/17/2014.  Medications have been reviewed per [facility] they are fairly minimal   Lasix 20 mg as needed daily. Vicodin 5325 mg one tablet every 6 hours when necessary.  Lopressor 25 mg twice a day.  Currently on Bactrim DS twice a day we will extend this to April 6.  Review of systems.  In general does not complain of any fever or chills appears to be doing well.  Skin does not complain of rashes or itching. History of lower extremity cellulitis as noted above  Head ears eyes nose mouth and throat-does not complain of any sore throat or visual changes.  Respiratory no complaints of shortness of breath or cough.  Cardiac does not complain of chest pain appears to have some chronic venous stasis changes.  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  Musculoskeletal is not really complaining of significant joint pain  .  No logic does not complain of dizziness or headache again has had some numbness of his legs he attributes possibly to edema and somewhat chronic it appears.  Psych does not complain of depression or anxiety.  Physical exam.  Temperature 98.3 pulse 62 respirations 18 blood pressure 125/79 weight  149.6   In general this is a very pleasant elderly male in no distress .  His skin is warm and dry he does have dressing applied over his shins bilaterally this is followed closely by wound care --I do note erythema around the wound on his right leg appears to be decreased from previous exam there is some slough apparent on the wound this is not new per wound care-- the wounds appear to be stable.  Heart is regular rate and rhythm  without murmur gallop or rub.  His chest is clear to auscultation there is no labored breathing--I would say he has trace bordering on one plus  lower extremity edema -this appears to be likely venous stasis.--Pedal pulses are intact bilaterally--skin is appropriate warmth to touch  Abdomen he does have colostomy bag in place this has a moderate amount of formed stool abdomen is soft nontender with active bowel sounds.  Muscle skeletal he is able to ambulate moves his extremities at baseline.--He is ambulatory  Neurologic is grossly intact he has touch sensation of his lower extremities bilaterally.--   Psych  he is alert and oriented 3 pleasant and appropriate  Labs.  12/02/2014.  Sodium 141 potassium 3.9 BUN 18 creatinine 0.92.  WBC 6.9 hemoglobin 12.2 platelets 241  Hemoglobin A1c-6.6.  TSH-0.84  11/15/2014.  WBC 9.4 hemoglobin 12.8 platelets 225.  Sodium 138 potassium 4.7 CO2 32 BUN 17 creatinine 1.08.  Of note glucoses on metabolic panel have been between 100-1 10  11/13/2014.  Albumin 3.1 otherwise liver function tests within normal limits   Assessment and plan.  #1-lower extremity edema with history of venous stasis changes this appears to be relatively at baseline --he has an order for Lasix when necessary I do not believe he has really use this much at all .   ct  Hypertension- This appears to be stable currently on Lopressor.    #3-lower extremity cellulitis-again he is completing a course of Bactrim for some concerns about increased erythema right lower leg although this erythema appears to be improving-we will extend his Bactrim until April 6-this will need follow-up by home health wound care  Will update a CBC before discharge to ensure stability.  He will also benefit from PT and OT again with home health follow-up as needed for follow-up of his wound issues.  He will be living at home he appears to be quite independent  [id number]-of note  greater than 30 minutes spent on this discharge .

## 2014-12-20 DIAGNOSIS — Z933 Colostomy status: Secondary | ICD-10-CM | POA: Diagnosis not present

## 2014-12-20 DIAGNOSIS — I10 Essential (primary) hypertension: Secondary | ICD-10-CM | POA: Diagnosis not present

## 2014-12-20 DIAGNOSIS — S81802D Unspecified open wound, left lower leg, subsequent encounter: Secondary | ICD-10-CM | POA: Diagnosis not present

## 2014-12-20 DIAGNOSIS — S81801D Unspecified open wound, right lower leg, subsequent encounter: Secondary | ICD-10-CM | POA: Diagnosis not present

## 2014-12-22 DIAGNOSIS — S81802D Unspecified open wound, left lower leg, subsequent encounter: Secondary | ICD-10-CM | POA: Diagnosis not present

## 2014-12-22 DIAGNOSIS — S81801D Unspecified open wound, right lower leg, subsequent encounter: Secondary | ICD-10-CM | POA: Diagnosis not present

## 2014-12-22 DIAGNOSIS — Z933 Colostomy status: Secondary | ICD-10-CM | POA: Diagnosis not present

## 2014-12-22 DIAGNOSIS — I10 Essential (primary) hypertension: Secondary | ICD-10-CM | POA: Diagnosis not present

## 2014-12-28 DIAGNOSIS — Z933 Colostomy status: Secondary | ICD-10-CM | POA: Diagnosis not present

## 2014-12-28 DIAGNOSIS — I10 Essential (primary) hypertension: Secondary | ICD-10-CM | POA: Diagnosis not present

## 2014-12-28 DIAGNOSIS — S81802D Unspecified open wound, left lower leg, subsequent encounter: Secondary | ICD-10-CM | POA: Diagnosis not present

## 2014-12-28 DIAGNOSIS — S81801D Unspecified open wound, right lower leg, subsequent encounter: Secondary | ICD-10-CM | POA: Diagnosis not present

## 2014-12-29 DIAGNOSIS — S81802S Unspecified open wound, left lower leg, sequela: Secondary | ICD-10-CM | POA: Diagnosis not present

## 2014-12-29 DIAGNOSIS — Z6824 Body mass index (BMI) 24.0-24.9, adult: Secondary | ICD-10-CM | POA: Diagnosis not present

## 2014-12-29 DIAGNOSIS — S81801S Unspecified open wound, right lower leg, sequela: Secondary | ICD-10-CM | POA: Diagnosis not present

## 2015-01-04 DIAGNOSIS — I10 Essential (primary) hypertension: Secondary | ICD-10-CM | POA: Diagnosis not present

## 2015-01-04 DIAGNOSIS — S81802D Unspecified open wound, left lower leg, subsequent encounter: Secondary | ICD-10-CM | POA: Diagnosis not present

## 2015-01-04 DIAGNOSIS — Z933 Colostomy status: Secondary | ICD-10-CM | POA: Diagnosis not present

## 2015-01-04 DIAGNOSIS — S81801D Unspecified open wound, right lower leg, subsequent encounter: Secondary | ICD-10-CM | POA: Diagnosis not present

## 2015-01-10 DIAGNOSIS — S81802D Unspecified open wound, left lower leg, subsequent encounter: Secondary | ICD-10-CM | POA: Diagnosis not present

## 2015-01-10 DIAGNOSIS — Z6823 Body mass index (BMI) 23.0-23.9, adult: Secondary | ICD-10-CM | POA: Diagnosis not present

## 2015-01-10 DIAGNOSIS — S81801D Unspecified open wound, right lower leg, subsequent encounter: Secondary | ICD-10-CM | POA: Diagnosis not present

## 2015-01-11 DIAGNOSIS — I10 Essential (primary) hypertension: Secondary | ICD-10-CM | POA: Diagnosis not present

## 2015-01-11 DIAGNOSIS — S81802D Unspecified open wound, left lower leg, subsequent encounter: Secondary | ICD-10-CM | POA: Diagnosis not present

## 2015-01-11 DIAGNOSIS — Z933 Colostomy status: Secondary | ICD-10-CM | POA: Diagnosis not present

## 2015-01-11 DIAGNOSIS — S81801D Unspecified open wound, right lower leg, subsequent encounter: Secondary | ICD-10-CM | POA: Diagnosis not present

## 2015-01-18 DIAGNOSIS — S81802D Unspecified open wound, left lower leg, subsequent encounter: Secondary | ICD-10-CM | POA: Diagnosis not present

## 2015-01-18 DIAGNOSIS — S81801D Unspecified open wound, right lower leg, subsequent encounter: Secondary | ICD-10-CM | POA: Diagnosis not present

## 2015-01-18 DIAGNOSIS — I10 Essential (primary) hypertension: Secondary | ICD-10-CM | POA: Diagnosis not present

## 2015-01-18 DIAGNOSIS — Z933 Colostomy status: Secondary | ICD-10-CM | POA: Diagnosis not present

## 2015-01-19 DIAGNOSIS — Z6824 Body mass index (BMI) 24.0-24.9, adult: Secondary | ICD-10-CM | POA: Diagnosis not present

## 2015-01-19 DIAGNOSIS — L089 Local infection of the skin and subcutaneous tissue, unspecified: Secondary | ICD-10-CM | POA: Diagnosis not present

## 2015-01-21 DIAGNOSIS — G894 Chronic pain syndrome: Secondary | ICD-10-CM | POA: Diagnosis not present

## 2015-01-21 DIAGNOSIS — L03119 Cellulitis of unspecified part of limb: Secondary | ICD-10-CM | POA: Diagnosis not present

## 2015-01-21 DIAGNOSIS — Z6824 Body mass index (BMI) 24.0-24.9, adult: Secondary | ICD-10-CM | POA: Diagnosis not present

## 2015-01-25 ENCOUNTER — Encounter (HOSPITAL_COMMUNITY): Payer: Self-pay | Admitting: *Deleted

## 2015-01-25 ENCOUNTER — Emergency Department (HOSPITAL_COMMUNITY)
Admission: EM | Admit: 2015-01-25 | Discharge: 2015-01-25 | Disposition: A | Discharge status: Home / Self Care | Payer: Commercial Managed Care - HMO | Attending: Emergency Medicine | Admitting: Emergency Medicine

## 2015-01-25 DIAGNOSIS — Z872 Personal history of diseases of the skin and subcutaneous tissue: Secondary | ICD-10-CM | POA: Insufficient documentation

## 2015-01-25 DIAGNOSIS — I878 Other specified disorders of veins: Secondary | ICD-10-CM

## 2015-01-25 DIAGNOSIS — Z79899 Other long term (current) drug therapy: Secondary | ICD-10-CM | POA: Insufficient documentation

## 2015-01-25 DIAGNOSIS — S81802D Unspecified open wound, left lower leg, subsequent encounter: Secondary | ICD-10-CM | POA: Diagnosis not present

## 2015-01-25 DIAGNOSIS — I872 Venous insufficiency (chronic) (peripheral): Secondary | ICD-10-CM | POA: Insufficient documentation

## 2015-01-25 DIAGNOSIS — M79604 Pain in right leg: Secondary | ICD-10-CM | POA: Diagnosis present

## 2015-01-25 DIAGNOSIS — S81801D Unspecified open wound, right lower leg, subsequent encounter: Secondary | ICD-10-CM | POA: Diagnosis not present

## 2015-01-25 DIAGNOSIS — Z933 Colostomy status: Secondary | ICD-10-CM | POA: Diagnosis not present

## 2015-01-25 DIAGNOSIS — I10 Essential (primary) hypertension: Secondary | ICD-10-CM | POA: Diagnosis not present

## 2015-01-25 MED ORDER — OXYCODONE-ACETAMINOPHEN 5-325 MG PO TABS
1.0000 | ORAL_TABLET | Freq: Once | ORAL | Status: AC
  Administered 2015-01-25: 1 via ORAL
  Filled 2015-01-25: qty 1

## 2015-01-25 NOTE — ED Notes (Signed)
MD at bedside. 

## 2015-01-25 NOTE — Discharge Instructions (Signed)
Stasis Ulcer Stasis ulcers occur in the legs when the circulation is damaged. An ulcer may look like a small hole in the skin.  CAUSES Stasis ulcers occur because your veins do not work properly. Veins have valves that help the blood return to the heart. If these valves do not work right, blood flows backwards and backs up into the veins near the skin. This condition causes the veins to become larger because of increased pressure and may lead to a stasis ulcer. SYMPTOMS   Shallow (superficial) sore on the leg.  Clear drainage or weeping from the sore.  Leg pain or a feeling of heaviness. This may be worse at the end of the day.  Leg swelling.  Skin color changes. DIAGNOSIS  Your caregiver will make a diagnosis by examining your leg. Your caregiver may order tests such as an ultrasound or other studies to evaluate the blood flow of the leg. HOME CARE INSTRUCTIONS   Do not stand or sit in one position for long periods of time. Do not sit with your legs crossed. Rest with your legs raised during the day. If possible, it is best if you can elevate your legs above your heart for 30 minutes, 3 to 4 times a day.  Wear elastic stockings or support hose. Do not wear other tight encircling garments around legs, pelvis, or waist. This causes increased pressure in your veins. If your caregiver has applied compressive medicated wraps, use them as instructed.  Walk as much as possible to increase blood flow. If you are taking long rides in a car or plane, take a break to walk around every 2 hours. If not already on aspirin, take a baby aspirin before long trips unless you have medical reasons that prohibit this.  Raise the foot of your bed at night with 2-inch blocks if approved by your caregiver. This may not be desirable if you have heart failure or breathing problems.  If you get a cut in the skin over the vein and the vein bleeds, lie down with your leg raised and gently clean the area with a clean  cloth. Apply pressure on the cut until the bleeding stops. Then place a dressing on the cut. See your caregiver if it continues to bleed or needs stitches. Also, see your caregiver if you develop an infection.Signs of an infection include a fever, redness, increased pain, and drainage of pus.  If your caregiver has given you a follow-up appointment, it is very important to keep that appointment. Not keeping the appointment could result in a chronic or permanent injury, pain, and disability. If there is any problem keeping the appointment, call your caregiver for assistance. SEEK IMMEDIATE MEDICAL CARE IF:  The ulcer area starts to break down.  You have pain, redness, tenderness, pus, or hard swelling in your leg over a vein or near the ulcer.  Your leg pain is uncomfortable.  You develop an unexplained fever.  You develop chest pain or shortness of breath. Document Released: 05/29/2001 Document Revised: 11/26/2011 Document Reviewed: 12/24/2010 [facility] Patient Information 2015 [place], [place]. This information is not intended to replace advice given to you by your health care provider. Make sure you discuss any questions you have with your health care provider.  Venous Stasis or Chronic Venous Insufficiency Chronic venous insufficiency, also called venous stasis, is a condition that affects the veins in the legs. The condition prevents blood from being pumped through these veins effectively. Blood may no longer be pumped effectively  from the legs back to the heart. This condition can range from mild to severe. With proper treatment, you should be able to continue with an active life. CAUSES  Chronic venous insufficiency occurs when the vein walls become stretched, weakened, or damaged or when valves within the vein are damaged. Some common causes of this include:  High blood pressure inside the veins (venous hypertension).  Increased blood pressure in the leg veins from long periods of  sitting or standing.  A blood clot that blocks blood flow in a vein (deep vein thrombosis).  Inflammation of a superficial vein (phlebitis) that causes a blood clot to form. RISK FACTORS Various things can make you more likely to develop chronic venous insufficiency, including:  Family history of this condition.  Obesity.  Pregnancy.  Sedentary lifestyle.  Smoking.  Jobs requiring long periods of standing or sitting in one place.  Being a certain age. Women in their 9340s and 2550s and men in their 6270s are more likely to develop this condition. SIGNS AND SYMPTOMS  Symptoms may include:   Varicose veins.  Skin breakdown or ulcers.  Reddened or discolored skin on the leg.  Brown, smooth, tight, and painful skin just above the ankle, usually on the inside surface (lipodermatosclerosis).  Swelling. DIAGNOSIS  To diagnose this condition, your health care provider will take a medical history and do a physical exam. The following tests may be ordered to confirm the diagnosis:  Duplex ultrasound--A procedure that produces a picture of a blood vessel and nearby organs and also provides information on blood flow through the blood vessel.  Plethysmography--A procedure that tests blood flow.  A venogram, or venography--A procedure used to look at the veins using X-ray and dye. TREATMENT The goals of treatment are to help you return to an active life and to minimize pain or disability. Treatment will depend on the severity of the condition. Medical procedures may be needed for severe cases. Treatment options may include:   Use of compression stockings. These can help with symptoms and lower the chances of the problem getting worse, but they do not cure the problem.  Sclerotherapy--A procedure involving an injection of a material that "dissolves" the damaged veins. Other veins in the network of blood vessels take over the function of the damaged veins.  Surgery to remove the vein or cut  off blood flow through the vein (vein stripping or laser ablation surgery).  Surgery to repair a valve. HOME CARE INSTRUCTIONS   Wear compression stockings as directed by your health care provider.  Only take over-the-counter or prescription medicines for pain, discomfort, or fever as directed by your health care provider.  Follow up with your health care provider as directed. SEEK MEDICAL CARE IF:   You have redness, swelling, or increasing pain in the affected area.  You see a red streak or line that extends up or down from the affected area.  You have a breakdown or loss of skin in the affected area, even if the breakdown is small.  You have an injury to the affected area. SEEK IMMEDIATE MEDICAL CARE IF:   You have an injury and open wound in the affected area.  Your pain is severe and does not improve with medicine.  You have sudden numbness or weakness in the foot or ankle below the affected area, or you have trouble moving your foot or ankle.  You have a fever or persistent symptoms for more than 2-3 days.  You have a fever  and your symptoms suddenly get worse. MAKE SURE YOU:   Understand these instructions.  Will watch your condition.  Will get help right away if you are not doing well or get worse. Document Released: 01/07/2007 Document Revised: 06/24/2013 Document Reviewed: 05/11/2013 [facility] Patient Information 2015 [place], [place]. This information is not intended to replace advice given to you by your health care provider. Make sure you discuss any questions you have with your health care provider.

## 2015-01-25 NOTE — ED Provider Notes (Signed)
CSN: [id number]     Arrival date & time 01/25/15  1520 History   First MD Initiated Contact with Patient 01/25/15 1535     Chief Complaint  Patient presents with  . Leg Pain     (Consider location/radiation/quality/duration/timing/severity/associated sxs/prior Treatment) HPI   69 year old male presenting for evaluation of sores on bilateral lower extremities. Right worse than left. Patient has had ongoing sores to bilateral shins. Previous notes mention venous stasis. Has also been previously treated for cellulitis. He reports that wounds have been worsening on the right leg over the past couple weeks. Is currently on cephalexin and takes hydrocodone for pain. Is referred to the emergency room for further evaluation of these wounds. He denies any fevers or chills. Ulcerated areas "sting." Denies significant pain elsewhere. Reports compliance with his medications.  Past Medical History  Diagnosis Date  . Medical history non-contributory    Past Surgical History  Procedure Laterality Date  . Colon surgery    . Colostomy     Family History  Problem Relation Age of Onset  . Hypertension Mother   . Hypertension Father   . CAD Father    History  Substance Use Topics  . Smoking status: Never Smoker   . Smokeless tobacco: Never Used  . Alcohol Use: No    Review of Systems  All systems reviewed and negative, other than as noted in HPI.   Allergies  Review of patient's allergies indicates no known allergies.  Home Medications   Prior to Admission medications   Medication Sig Start Date End Date Taking? Authorizing Provider  furosemide (LASIX) 20 MG tablet Take 1 tablet (20 mg total) by mouth daily. Take one tab daily for 5 days then as needed for swelling Patient taking differently: Take 20 mg by mouth daily. Take one tab daily PRN as needed for swelling 11/15/14   Erick BlinksJehanzeb Memon, MD  HYDROcodone-acetaminophen (NORCO) 5-325 MG per tablet Take 1 tablet by mouth every 6 (six)  hours as needed for moderate pain. 11/22/14   Tiffany L Reed, DO  metoprolol tartrate (LOPRESSOR) 25 MG tablet Take 1 tablet (25 mg total) by mouth 2 (two) times daily. 11/15/14   Erick BlinksJehanzeb Memon, MD   BP 168/102 mmHg  Pulse 67  Temp(Src) 98.5 F (36.9 C) (Oral)  Resp 18  Ht 5\' 7"  (1.702 m)  Wt 150 lb (68.04 kg)  BMI 23.49 kg/m2  SpO2 99% Physical Exam  Constitutional: He appears well-developed and well-nourished. No distress.  Laying in bed. NAD.   HENT:  Head: Normocephalic and atraumatic.  Eyes: Conjunctivae are normal. Right eye exhibits no discharge. Left eye exhibits no discharge.  Neck: Neck supple.  Cardiovascular: Normal rate, regular rhythm and normal heart sounds.  Exam reveals no gallop and no friction rub.   No murmur heard. Pulmonary/Chest: Effort normal and breath sounds normal. No respiratory distress.  Abdominal: Soft. He exhibits no distension. There is no tenderness.  Musculoskeletal: He exhibits no edema or tenderness.  Neurological: He is alert.  Skin: Skin is warm and dry.  Skin changes to b/l anterior lower legs. Some ulceration on R with white, fibrinous exudate at base. No purulence. Minimally tender to palpation. Mild symmetric edema. No calf tenderness. No pressure sores. Easily palpable DP pulses. Sensation intact to light touch.   Psychiatric: He has a normal mood and affect. His behavior is normal. Thought content normal.  Nursing note and vitals reviewed.   ED Course  Procedures (including critical care time) Labs Review Labs  Reviewed - No data to display  Imaging Review No results found.   EKG Interpretation None      MDM   Final diagnoses:  Venous stasis    68yM with b/l LE wounds. Currently being tx'd for cellulitis, but this seems most consistent with venous stasis. Good DP pulses b/l. Some white, fibrinous exudate on some of the wounds on RLE, but not purulence. Reports that ulcerated areas sting some, but otherwise not painful.  Minimal tenderness to palpation. He is afebrile. Generally appears well. Currently getting some home nursing care and reports [facility] which I feel is primarily what he needs.     Raeford RazorStephen Shawndale Kilpatrick, MD 01/25/15 [phone]

## 2015-01-25 NOTE — ED Notes (Signed)
Sent from MD office for "sore place on my leg"

## 2015-01-27 ENCOUNTER — Ambulatory Visit (HOSPITAL_COMMUNITY): Payer: Commercial Managed Care - HMO | Attending: Family Medicine | Admitting: Physical Therapy

## 2015-01-27 DIAGNOSIS — T148XXD Other injury of unspecified body region, subsequent encounter: Secondary | ICD-10-CM

## 2015-01-27 DIAGNOSIS — S81802D Unspecified open wound, left lower leg, subsequent encounter: Secondary | ICD-10-CM | POA: Insufficient documentation

## 2015-01-27 DIAGNOSIS — X58XXXD Exposure to other specified factors, subsequent encounter: Secondary | ICD-10-CM | POA: Insufficient documentation

## 2015-01-27 NOTE — Therapy (Signed)
[facility] [address] [place], [place], [address] Phone: [phone]   Fax:  [phone]  Patient Details  Name: Scott Matthews MRN: [id number] Date of Birth: [birth year] Referring Provider:  Assunta FoundGolding, John, MD  Encounter Date: 01/27/2015   Patient reported on time for therapy evaluation; began interview however patient stated that he was still receiving home health care for his wound. Patient educated that his insurance will not allow him to receive wound care at PT clinic while he is still receiving it from home health agency. Contacted home health agency and they confirmed that he is still on their schedule for wound care. Patient states that he would like a few days to think about whether he wants to keep being seen by home health or whether he would like to be discharged from home health and start wound care with skilled PT. Gave patient a copy of clinic's number; patient states that he will call clinic back once he has made his decision.    Nedra HaiKristen Valen Mascaro PT, DPT [phone]  [facility] [address] [place], [place], [address] Phone: [phone]   Fax:  [phone]

## 2015-01-28 ENCOUNTER — Telehealth (HOSPITAL_COMMUNITY): Payer: Self-pay | Admitting: Physical Therapy

## 2015-01-28 DIAGNOSIS — S81801D Unspecified open wound, right lower leg, subsequent encounter: Secondary | ICD-10-CM | POA: Diagnosis not present

## 2015-01-28 DIAGNOSIS — Z933 Colostomy status: Secondary | ICD-10-CM | POA: Diagnosis not present

## 2015-01-28 DIAGNOSIS — I10 Essential (primary) hypertension: Secondary | ICD-10-CM | POA: Diagnosis not present

## 2015-01-28 DIAGNOSIS — S81802D Unspecified open wound, left lower leg, subsequent encounter: Secondary | ICD-10-CM | POA: Diagnosis not present

## 2015-01-28 NOTE — Telephone Encounter (Signed)
Scott LyonsCasey advised me to keep this appt as an Eval @ 45 min and Therapist would decided if pt needed bi-lateral care at time of 2nd eval. Kristen saw pt 01/27/15 but did not do wound care b/c pt was still having home healthcare. Patient was D/c from home healthcare today 01/28/15. NF

## 2015-01-31 ENCOUNTER — Ambulatory Visit (HOSPITAL_COMMUNITY): Payer: Commercial Managed Care - HMO | Admitting: Physical Therapy

## 2015-01-31 DIAGNOSIS — X58XXXD Exposure to other specified factors, subsequent encounter: Secondary | ICD-10-CM | POA: Diagnosis not present

## 2015-01-31 DIAGNOSIS — S81802D Unspecified open wound, left lower leg, subsequent encounter: Secondary | ICD-10-CM | POA: Diagnosis not present

## 2015-01-31 DIAGNOSIS — T148XXD Other injury of unspecified body region, subsequent encounter: Secondary | ICD-10-CM

## 2015-01-31 DIAGNOSIS — T148XXA Other injury of unspecified body region, initial encounter: Secondary | ICD-10-CM

## 2015-01-31 NOTE — Therapy (Signed)
[facility] [address] [place], [place], [address] Phone: [phone]   Fax:  [phone]  Wound Care Evaluation  Patient Details  Name: Scott Matthews MRN: [id number] Date of Birth: 07-01-1946 Referring Provider:  Assunta FoundGolding, John, MD  Encounter Date: 01/31/2015      PT End of Session - 01/31/15 1632    Visit Number 1   Number of Visits 12   Date for PT Re-Evaluation 03/02/15   Authorization Type medicare    PT Start Time 1530   PT Stop Time 1630   PT Time Calculation (min) 60 min      Past Medical History  Diagnosis Date  . Medical history non-contributory     Past Surgical History  Procedure Laterality Date  . Colon surgery    . Colostomy      There were no vitals filed for this visit.  Visit Diagnosis:  Wound healing, delayed  Nonhealing nonsurgical wound         Wound Therapy - 01/31/15 1458    Subjective [name] is a 69 yo male who states he injured B anterior shins in February cutting  wood.  [facility] discharged on 2/29 to SNF  [facility].  HH services discharged him to OP therapy on 01/28/2015.  At this time his wounds still have not healed    Patient and Family Stated Goals wounds to heal    Date of Onset 10/29/14   Prior Treatments acute, SNF and HH; IV, oral antibiotic and dressing changes using santyl    Pain Assessment 0-10   Pain Score 4   with debridement pain went up to an 8/10    Pain Type Chronic pain   Pain Location Leg   Pain Orientation Right   Pain Descriptors / Indicators Aching;Sore   Pain Onset On-going   Patients Stated Pain Goal 0   Pain Intervention(s) Other (Comment)  compression dressing    Evaluation and Treatment Procedures Explained to Patient/Family Yes   Evaluation and Treatment Procedures agreed to   Wound Properties Date First Assessed: 01/31/15 Time First Assessed: 1530 Wound Type: Puncture  Location: Leg Location Orientation: Right;Anterior Present on Admission: Yes   Dressing Type Gauze (Comment);Non adherent  =old dressing    Dressing Changed Changed   Dressing Status Old drainage   Dressing Change Frequency Other (Comment)  pt was changing dressing daily    Site / Wound Assessment Dusky;Red;Yellow   % Wound base Red or Granulating 20%   % Wound base Yellow 80%   Wound Length (cm) 6 cm   Wound Width (cm) 3 cm   Closure None   Drainage Amount Minimal   Drainage Description Purulent   Treatment Cleansed;Debridement (Selective)   Wound Properties Date First Assessed: 01/31/15 Time First Assessed: 1622 Wound Type: Puncture Location: Leg Location Orientation: Left;Anterior   Dressing Type Non adherent  = old dressing    Dressing Changed Changed   Dressing Status Clean   Dressing Change Frequency Daily   % Wound base Red or Granulating 75%   % Wound base Yellow 25%   Wound Length (cm) 2.8 cm   Wound Width (cm) 2.1 cm   Drainage Amount Minimal   Treatment Cleansed;Debridement (Selective)   Selective Debridement - Location wound bed    Selective Debridement - Tools Used Forceps   Selective Debridement - Tissue Removed --  slough; dry skin  Wound Therapy - Clinical Statement [name] is a 69 yo male who has a complicated non-healing wound.  Treatment includes multiple antibiotica as well as nursing dressing changes.  He is now bering referred to out-patient services to improve the environment to allow the wounds to heal    Wound Therapy - Functional Problem List painful to walk    Factors Delaying/Impairing Wound Healing Altered sensation;Diabetes Mellitus;Infection - systemic/local;Vascular compromise   Hydrotherapy Plan Debridement;Dressing change   Wound Therapy - Frequency --  2x a week x 6 weeks    Wound Therapy - Current Recommendations PT   Wound Plan Pt to be seen twice a week for cleansing, moisurizing, debridement and compression dressing with profore to  B LE    Dressing  medihoney and profore compression wrap.  Pt has a good pedal pulse.    Decrease Necrotic Tissue to STG:  3 weeks 0%   Decrease Necrotic Tissue - Progress Goal set today   Increase Granulation Tissue to TG:  3 weeks 100%   Increase Granulation Tissue - Progress Goal set today   Decrease Length/Width/Depth by (cm) STG:  3cmx 1.5cm; 6weeks 5cm x .5 cm    Decrease Length/Width/Depth - Progress Goal set today   Improve Drainage Characteristics Other (comment)  STG: 3 weeks scant    Improve Drainage Characteristics - Progress Goal set today   Patient/Family will be able to  --  verbalize the importance of compression in healing  2 weeks    Additional Wound Therapy Goal STG:  Pt pain level to be no greater than a 4/10 ; LTG 6 weeks: PT pain level to be no greater than a 2/10   Additional Wound Therapy Goal - Progress Goal set today   Time For Goal Achievement --  6 weeks    Wound Therapy - Potential for Goals Good                  PT Education - 01/31/15 1630    Education provided Yes   Education Details take one layer at a time off if dressing causes increased pain   Person(s) Educated Patient   Methods Explanation   Comprehension Verbalized understanding               G-Codes - 01/31/15 1633    Functional Assessment Tool Used % yellow    Functional Limitation Other PT primary   Other PT Primary Current Status (U0454(G8990) At least 80 percent but less than 100 percent impaired, limited or restricted   Other PT Primary Goal Status (U9811(G8991) At least 1 percent but less than 20 percent impaired, limited or restricted      Problem List Patient Active Problem List   Diagnosis Date Noted  . Cough 12/08/2014  . Essential hypertension   . Venous stasis   . Cellulitis 11/12/2014  . Hypertensive urgency 11/12/2014  . Fracture, finger, distal phalanx, open 11/17/2013    Virgina Organynthia Aleathea Pugmire, PT CLT [phone] 01/31/2015, 4:34 PM  [facility] [address] [place], [place], [address] Phone: [phone]   Fax:  [phone]

## 2015-02-03 ENCOUNTER — Ambulatory Visit (HOSPITAL_COMMUNITY): Payer: Commercial Managed Care - HMO

## 2015-02-03 DIAGNOSIS — T148XXD Other injury of unspecified body region, subsequent encounter: Secondary | ICD-10-CM

## 2015-02-03 DIAGNOSIS — S81802D Unspecified open wound, left lower leg, subsequent encounter: Secondary | ICD-10-CM | POA: Diagnosis not present

## 2015-02-03 DIAGNOSIS — T148XXA Other injury of unspecified body region, initial encounter: Secondary | ICD-10-CM

## 2015-02-03 NOTE — Therapy (Signed)
[facility] [address] [place], [place], [address] Phone: [phone]   Fax:  [phone]  Wound Care Therapy  Patient Details  Name: Scott Matthews MRN: [id number] Date of Birth: [birth year] Referring Provider:  Assunta FoundGolding, John, MD  Encounter Date: 02/03/2015      PT End of Session - 02/03/15 1407    Visit Number 2   Number of Visits 12   Date for PT Re-Evaluation 03/02/15   Authorization Type medicare    PT Start Time 1110   PT Stop Time 1220   PT Time Calculation (min) 70 min   Activity Tolerance Patient tolerated treatment well   Behavior During Therapy [facility] for tasks assessed/performed      Past Medical History  Diagnosis Date  . Medical history non-contributory     Past Surgical History  Procedure Laterality Date  . Colon surgery    . Colostomy      There were no vitals filed for this visit.  Visit Diagnosis:  Wound healing, delayed  Nonhealing nonsurgical wound      Subjective Assessment - 02/03/15 1256    Subjective Pt stated legs are feeling better today, pt entered dept ambulating with RW for support.  Pain scale 2/10 Rt> Lt LE   Currently in Pain? Yes   Pain Score 2    Pain Location Leg   Pain Orientation Right;Left             Wound Therapy - 02/03/15 1256    Subjective Pt stated legs are feeling better today, pt entered dept ambulating with RW for support.  Pain scale 2/10 Rt> Lt LE   Patient and Family Stated Goals wounds to heal    Date of Onset 10/29/14   Prior Treatments acute, SNF and HH; IV, oral antibiotic and dressing changes    Pain Descriptors / Indicators Aching  stinging   Pain Onset On-going   Patients Stated Pain Goal 0   Wound Properties Date First Assessed: 01/31/15 Time First Assessed: 1530 Wound Type: Puncture Location: Leg Location Orientation: Right;Anterior Present on Admission: Yes   Dressing Type Gauze (Comment);Compression wrap  Colloid Medihoney; vaseline  perimeter; profore; netting   Dressing Changed Changed   Dressing Status Clean;Dry;Intact   Dressing Change Frequency --  2x daily   Site / Wound Assessment Dusky;Red;Yellow  macerated border   % Wound base Red or Granulating 35%   % Wound base Yellow 65%   Drainage Amount Minimal   Drainage Description Purulent   Treatment Cleansed;Debridement (Selective)   Wound Properties Date First Assessed: 01/31/15 Time First Assessed: 1622 Wound Type: Puncture Location: Leg Location Orientation: Left;Anterior   Dressing Type Gauze (Comment);Impregnated gauze (petrolatum);Compression wrap  Colloid Medihoney; vaseline perimeter; profore; netting   Dressing Changed Changed   Dressing Status Clean;Dry;Intact   Dressing Change Frequency --  2x week   % Wound base Red or Granulating 80%   % Wound base Yellow 20%   Drainage Amount Minimal   Treatment Cleansed;Debridement (Selective)   Selective Debridement - Location wound bed and removal of dry skin perimeter of wound   Selective Debridement - Tools Used Forceps;Scalpel   Selective Debridement - Tissue Removed slough and dry skin   Wound Therapy - Clinical Statement Noted maceration perimeter of wound, selective debridement complete per pt.'s tolerance for removal of dry skin and slough in wound bed.  Continued with colloid medihoney with vaseline perimeter of wound for skin integrity, profore Bil LE for edema control.  Pt reported slight increased in pain during gait at end of session.     Wound Therapy - Functional Problem List painful to walk    Factors Delaying/Impairing Wound Healing Altered sensation;Diabetes Mellitus;Infection - systemic/local;Vascular compromise   Hydrotherapy Plan Debridement;Dressing change   Wound Therapy - Frequency --  2x week   Wound Therapy - Current Recommendations PT   Wound Plan Pt to be seen twice a week for cleansing, moisurizing, debridement and compression dressing with profore to B LE    Dressing  colloid  medihoney and profore compression wrap.   Decrease Necrotic Tissue to STG:  3 weeks 0%   Decrease Necrotic Tissue - Progress Progressing toward goal   Increase Granulation Tissue to TG:  3 weeks 100%   Increase Granulation Tissue - Progress Progressing toward goal   Decrease Length/Width/Depth by (cm) STG:  3cmx 1.5cm; 6weeks 5cm x .5 cm    Decrease Length/Width/Depth - Progress Progressing toward goal   Additional Wound Therapy Goal STG:  Pt pain level to be no greater than a 4/10 ; LTG 6 weeks: PT pain level to be no greater than a 2/10   Additional Wound Therapy Goal - Progress Progressing toward goal          Problem List Patient Active Problem List   Diagnosis Date Noted  . Cough 12/08/2014  . Essential hypertension   . Venous stasis   . Cellulitis 11/12/2014  . Hypertensive urgency 11/12/2014  . Fracture, finger, distal phalanx, open 11/17/2013   Scott Matthews [id number]  Scott Matthews 02/03/2015, 2:09 PM  [place] [facility] [address] [place], [place], [address] Phone: [phone]   Fax:  [phone]

## 2015-02-07 ENCOUNTER — Ambulatory Visit (HOSPITAL_COMMUNITY): Payer: Commercial Managed Care - HMO

## 2015-02-07 DIAGNOSIS — S81802D Unspecified open wound, left lower leg, subsequent encounter: Secondary | ICD-10-CM | POA: Diagnosis not present

## 2015-02-07 DIAGNOSIS — T148XXA Other injury of unspecified body region, initial encounter: Secondary | ICD-10-CM

## 2015-02-07 DIAGNOSIS — T148XXD Other injury of unspecified body region, subsequent encounter: Secondary | ICD-10-CM

## 2015-02-07 NOTE — Therapy (Signed)
[facility] [address] [place], [place], [address] Phone: [phone]   Fax:  [phone]  Wound Care Therapy  Patient Details  Name: Scott Matthews MRN: [id number] Date of Birth: [birth year] Referring Provider:  Assunta FoundGolding, John, MD  Encounter Date: 02/07/2015      PT End of Session - 02/07/15 1455    Visit Number 3   Number of Visits 12   Date for PT Re-Evaluation 03/02/15   Authorization Type medicare    PT Start Time 1145   PT Stop Time 1245   PT Time Calculation (min) 60 min   Activity Tolerance Patient tolerated treatment well   Behavior During Therapy [facility] for tasks assessed/performed      Past Medical History  Diagnosis Date  . Medical history non-contributory     Past Surgical History  Procedure Laterality Date  . Colon surgery    . Colostomy      There were no vitals filed for this visit.  Visit Diagnosis:  Wound healing, delayed  Nonhealing nonsurgical wound      Subjective Assessment - 02/07/15 1448    Subjective Pt stated legs are feeling better following last session, pt entered into dept ambulating with no AD.    Currently in Pain? Yes   Pain Score 2    Pain Location Leg   Pain Orientation Right;Left   Pain Descriptors / Indicators Aching             Wound Therapy - 02/07/15 1448    Subjective Pt stated legs are feeling better following last session, pt entered into dept ambulating with no AD.    Patient and Family Stated Goals wounds to heal    Date of Onset 10/29/14   Prior Treatments acute, SNF and HH; IV, oral antibiotic and dressing changes    Pain Assessment 0-10   Pain Onset On-going   Patients Stated Pain Goal 0   Wound Properties Date First Assessed: 01/31/15 Time First Assessed: 1530 Wound Type: Puncture Location: Leg Location Orientation: Right;Anterior Present on Admission: Yes   Dressing Type --  .    Dressing Changed Changed   Dressing Status Clean;Dry;Intact   Dressing  Change Frequency Other (Comment)  2x week   Site / Wound Assessment Red;Yellow   % Wound base Red or Granulating 75%   % Wound base Yellow 25%   % Wound base Black 0%   Drainage Amount Minimal   Drainage Description Purulent   Treatment Cleansed;Debridement (Selective)   Wound Properties Date First Assessed: 01/31/15 Time First Assessed: 1622 Wound Type: Puncture Location: Leg Location Orientation: Left;Anterior   Dressing Type Gauze (Comment);Compression wrap;Impregnated gauze (petrolatum)  Colloid medihoney, gauze, vaseline and profore   Dressing Changed Changed   Dressing Status Clean;Dry;Intact   Dressing Change Frequency Other (Comment)  2x week   % Wound base Red or Granulating 90%   % Wound base Yellow 10%   Drainage Amount Minimal   Treatment Cleansed;Debridement (Selective)   Selective Debridement - Location wound bed and removal of dry skin perimeter of wound   Selective Debridement - Tools Used Forceps;Scalpel   Selective Debridement - Tissue Removed slough and dry skin   Wound Therapy - Clinical Statement Increased ease for removal of slough this session with improved granulation tissues following debridement.  Continued wtih vaseline perimeter of both wounds with application of colloid medihoney, gauze and Profore with netting.  Pt able to tolerate increased debridement this session with no reports of increased  pain through session.     Wound Therapy - Functional Problem List painful to walk    Factors Delaying/Impairing Wound Healing Altered sensation;Diabetes Mellitus;Infection - systemic/local;Vascular compromise   Hydrotherapy Plan Debridement;Dressing change   Wound Therapy - Frequency --  2x week   Wound Therapy - Current Recommendations PT   Wound Plan Pt to be seen twice a week for cleansing, moisurizing, debridement and compression dressing with profore to B LE    Dressing  colloid medihoney and profore compression wrap.      Problem List Patient Active  Problem List   Diagnosis Date Noted  . Cough 12/08/2014  . Essential hypertension   . Venous stasis   . Cellulitis 11/12/2014  . Hypertensive urgency 11/12/2014  . Fracture, finger, distal phalanx, open 11/17/2013   Becky Sax, [place]; [place] [id number] [phone]  Juel Burrow 02/07/2015, 2:56 PM  [place] [facility] [address] [place], [place], [address] Phone: [phone]   Fax:  [phone]

## 2015-02-10 ENCOUNTER — Ambulatory Visit (HOSPITAL_COMMUNITY): Payer: Commercial Managed Care - HMO | Admitting: Physical Therapy

## 2015-02-10 DIAGNOSIS — T148XXA Other injury of unspecified body region, initial encounter: Secondary | ICD-10-CM

## 2015-02-10 DIAGNOSIS — S81802D Unspecified open wound, left lower leg, subsequent encounter: Secondary | ICD-10-CM | POA: Diagnosis not present

## 2015-02-10 DIAGNOSIS — T148XXD Other injury of unspecified body region, subsequent encounter: Secondary | ICD-10-CM

## 2015-02-10 NOTE — Therapy (Signed)
[facility] [address] [place], [place], [address] Phone: [phone]   Fax:  [phone]  Wound Care Therapy  Patient Details  Name: Scott Matthews MRN: [id number] Date of Birth: [birth year] Referring Provider:  Assunta Found, MD  Encounter Date: 02/10/2015      PT End of Session - 02/10/15 1543    Visit Number 4   Number of Visits 12   Date for PT Re-Evaluation 03/02/15   Authorization Type medicare    PT Start Time 1145   PT Stop Time 1250   PT Time Calculation (min) 65 min   Activity Tolerance Patient tolerated treatment well   Behavior During Therapy [facility] for tasks assessed/performed      Past Medical History  Diagnosis Date  . Medical history non-contributory     Past Surgical History  Procedure Laterality Date  . Colon surgery    . Colostomy      There were no vitals filed for this visit.  Visit Diagnosis:  Wound healing, delayed  Nonhealing nonsurgical wound      Subjective Assessment - 02/10/15 1535    Subjective Patient continues to state that legs are feeling better with reduced pain with profore wraps                    Wound Therapy - 02/10/15 1537    Subjective patient states legs have been feeling better after profore wrapping with reduced overall pain    Patient and Family Stated Goals wounds to heal    Date of Onset 10/29/14   Prior Treatments acute, SNF and HH; IV, oral antibiotic and dressing changes    Pain Score 4    Pain Location Leg   Pain Orientation Right;Left   Wound Properties Date First Assessed: 01/31/15 Time First Assessed: 1530 Wound Type: Puncture Location: Leg Location Orientation: Right;Anterior Present on Admission: Yes   Dressing Type Gauze (Comment);Compression wrap   Dressing Changed Changed   Dressing Status Clean;Dry   Dressing Change Frequency Other (Comment)  2x/week   Site / Wound Assessment Red;Yellow   % Wound base Red or Granulating 75%   % Wound  base Yellow 25%   % Wound base Black 0%   Closure None   Drainage Amount Moderate   Drainage Description Serosanguineous;Purulent   Treatment Cleansed;Debridement (Selective)  medihoney, gauze, profore    Wound Properties Date First Assessed: 01/31/15 Time First Assessed: 1622 Wound Type: Puncture Location: Leg Location Orientation: Left;Anterior   Dressing Type Gauze (Comment);Compression wrap   Dressing Changed Changed   Dressing Status Clean;Dry   Dressing Change Frequency Other (Comment)  2x/week   % Wound base Red or Granulating 95%   % Wound base Yellow 5%   Drainage Amount Minimal   Treatment Cleansed;Debridement (Selective);Other (Comment)  medihoney, gauze, profore    Selective Debridement - Location wound bed    Selective Debridement - Tools Used Forceps   Wound Therapy - Clinical Statement Generally easy slough removal this session, most came off with use of gauze and wound cleaning spray as wounds were being cleansed before being re-dressed. Proximal wound on R LE continues to display slough that is difficult to remove; treated area with medihoney and gauze. Overall patient reports his pain is decreasing. Patient education regarding taking profore wraps off if legs begin to hurt or if toes change color.   Wound Therapy - Functional Problem List painful to walk    Factors Delaying/Impairing Wound Healing Altered sensation;Diabetes Mellitus;Infection -  systemic/local;Vascular compromise   Hydrotherapy Plan Debridement;Dressing change   Wound Therapy - Current Recommendations PT   Wound Plan Pt to be seen twice a week for cleansing, moisurizing, debridement and compression dressing with profore to B LE    Dressing  colloid medihoney and profore compression wrap.                 PT Education - 02/10/15 1542    Education provided Yes   Education Details remove dressings if pain increases or notices color change in toes    Person(s) Educated Patient   Methods  Explanation   Comprehension Verbalized understanding                     Problem List Patient Active Problem List   Diagnosis Date Noted  . Cough 12/08/2014  . Essential hypertension   . Venous stasis   . Cellulitis 11/12/2014  . Hypertensive urgency 11/12/2014  . Fracture, finger, distal phalanx, open 11/17/2013    Nedra HaiKristen Unger PT, DPT [phone]  [facility] [address] [place], [place], [address] Phone: [phone]   Fax:  [phone]

## 2015-02-15 ENCOUNTER — Ambulatory Visit (HOSPITAL_COMMUNITY): Payer: Commercial Managed Care - HMO

## 2015-02-15 DIAGNOSIS — S81802D Unspecified open wound, left lower leg, subsequent encounter: Secondary | ICD-10-CM | POA: Diagnosis not present

## 2015-02-15 DIAGNOSIS — T148XXD Other injury of unspecified body region, subsequent encounter: Secondary | ICD-10-CM

## 2015-02-15 DIAGNOSIS — T148XXA Other injury of unspecified body region, initial encounter: Secondary | ICD-10-CM

## 2015-02-15 NOTE — Therapy (Signed)
[place], [place], [address] Phone: [phone]   Fax:  [phone]  Wound Care Therapy  Patient Details  Name: Scott Matthews MRN: [id number] Date of Birth: December 18, 1945 Referring Provider:  Sharilyn Sites, MD  Encounter Date: 02/15/2015      PT End of Session - 02/15/15 1628    Visit Number 5   Number of Visits 12   Date for PT Re-Evaluation 03/02/15   Authorization Type medicare    PT Start Time 1142   PT Stop Time 1240   PT Time Calculation (min) 58 min   Activity Tolerance Patient tolerated treatment well   Behavior During Therapy [facility] for tasks assessed/performed      Past Medical History  Diagnosis Date  . Medical history non-contributory     Past Surgical History  Procedure Laterality Date  . Colon surgery    . Colostomy      There were no vitals filed for this visit.  Visit Diagnosis:  Wound healing, delayed  Nonhealing nonsurgical wound      Subjective Assessment - 02/15/15 1609    Subjective Pt entered dept ambulating with QC, he stated legs are feeling good loday, stated he took off the netting due to tightness over weekend.   Currently in Pain? Yes   Pain Score 3    Pain Location Leg   Pain Orientation Right   Pain Descriptors / Indicators Aching            Wound Therapy - 02/15/15 1609    Subjective Pt entered dept ambulating with QC, he stated legs are feeling good loday, stated he took off the netting due to tightness over weekend.   Patient and Family Stated Goals wounds to heal    Date of Onset 10/29/14   Prior Treatments acute, SNF and HH; IV, oral antibiotic and dressing changes    Wound Properties Date First Assessed: 01/31/15 Time First Assessed: 5053 Wound Type: Puncture Location: Leg Location Orientation: Right;Anterior Present on Admission: Yes   Dressing Type Gauze (Comment);Compression wrap  Colloid medihoney, vaseline perimeter, gauze and profore    Dressing Changed Changed   Dressing Status Clean;Dry;Intact   Dressing Change Frequency Other (Comment)  2x week   Site / Wound Assessment Red;Yellow   % Wound base Red or Granulating 75%   % Wound base Yellow 25%   % Wound base Black 0%   Wound Length (cm) 6 cm   Wound Width (cm) 2.6 cm   Wound Depth (cm) 0 cm   Closure None   Drainage Amount Moderate   Drainage Description Serosanguineous;Purulent   Treatment Cleansed;Debridement (Selective)   Wound Properties Date First Assessed: 01/31/15 Time First Assessed: 1622 Wound Type: Puncture Location: Leg Location Orientation: Left;Anterior   Dressing Type Gauze (Comment);Compression wrap  Colloid medihoney, vaseline perimeter, gauze and profore    Dressing Changed Changed   Dressing Status Clean;Dry;Intact   Dressing Change Frequency Other (Comment)  2x week   % Wound base Red or Granulating --  98%   % Wound base Yellow --  2%   Wound Length (cm) 1.2 cm   Wound Width (cm) 1.2 cm   Wound Depth (cm) 0 cm   Drainage Amount Minimal   Treatment Cleansed;Debridement (Selective)   Selective Debridement - Location wound bed    Selective Debridement - Tools Used Forceps;Scalpel   Selective Debridement - Tissue Removed slough and dry skin   Wound Therapy - Clinical Statement Bil LE  increased granualing and decreasing in overall wound size especially Lt LE.  Rt wound continues to have thick slough proximal wound with increased difficulty removing. Lt LE wound slough easily removed.  Continued with colloid medihoney, gauze and compression for edema control.     Wound Therapy - Functional Problem List painful to walk    Factors Delaying/Impairing Wound Healing Altered sensation;Diabetes Mellitus;Infection - systemic/local;Vascular compromise   Hydrotherapy Plan Debridement;Dressing change   Wound Therapy - Frequency --  2x week   Wound Therapy - Current Recommendations PT   Wound Plan Pt to be seen twice a week for cleansing, moisurizing,  debridement and compression dressing with profore to B LE    Dressing  colloid medihoney and profore compression wrap.   Decrease Necrotic Tissue to STG:  3 weeks 0%   Decrease Necrotic Tissue - Progress Met   Increase Granulation Tissue to TG:  3 weeks 100%   Increase Granulation Tissue - Progress Progressing toward goal   Decrease Length/Width/Depth by (cm) STG:  3cmx 1.5cm; 6weeks 5cm x .5 cm    Decrease Length/Width/Depth - Progress Progressing toward goal   Additional Wound Therapy Goal STG:  Pt pain level to be no greater than a 4/10 ; LTG 6 weeks: PT pain level to be no greater than a 2/10   Additional Wound Therapy Goal - Progress Progressing toward goal       Problem List Patient Active Problem List   Diagnosis Date Noted  . Cough 12/08/2014  . Essential hypertension   . Venous stasis   . Cellulitis 11/12/2014  . Hypertensive urgency 11/12/2014  . Fracture, finger, distal phalanx, open 11/17/2013   Aldona Lento, PTA  Aldona Lento 02/15/2015, 4:29 PM  [place] [address] [place], [place], [address] Phone: [phone]   Fax:  [phone]

## 2015-02-18 ENCOUNTER — Ambulatory Visit (HOSPITAL_COMMUNITY): Payer: Commercial Managed Care - HMO | Attending: Family Medicine

## 2015-02-18 DIAGNOSIS — T148XXA Other injury of unspecified body region, initial encounter: Secondary | ICD-10-CM

## 2015-02-18 DIAGNOSIS — T148XXD Other injury of unspecified body region, subsequent encounter: Secondary | ICD-10-CM

## 2015-02-18 DIAGNOSIS — S81802D Unspecified open wound, left lower leg, subsequent encounter: Secondary | ICD-10-CM | POA: Diagnosis not present

## 2015-02-18 NOTE — Therapy (Signed)
[place] [facility] [address] [place], [place], [address] Phone: [phone]   Fax:  [phone]  Wound Care Therapy  Patient Details  Name: Scott Matthews MRN: [id number] Date of Birth: 1946-08-02 Referring Provider:  Assunta Found, MD  Encounter Date: 02/18/2015      PT End of Session - 02/18/15 1500    Visit Number 6   Number of Visits 12   Date for PT Re-Evaluation 03/02/15   Authorization Type medicare    PT Start Time 1153   PT Stop Time 1248   PT Time Calculation (min) 55 min   Activity Tolerance Patient tolerated treatment well   Behavior During Therapy [facility] for tasks assessed/performed      Past Medical History  Diagnosis Date  . Medical history non-contributory     Past Surgical History  Procedure Laterality Date  . Colon surgery    . Colostomy      There were no vitals filed for this visit.  Visit Diagnosis:  Wound healing, delayed  Nonhealing nonsurgical wound      Subjective Assessment - 02/18/15 1447    Subjective Pt entered dept. holding QC in hands and increased cadence, stated he feels great today.  No reports of pain at entrance   Currently in Pain? No/denies             Wound Therapy - 02/18/15 1449    Subjective Pt entered dept. holding QC in hands and increased cadence, stated he feels great today.  No reports of pain at entrance   Patient and Family Stated Goals wounds to heal    Date of Onset 10/29/14   Prior Treatments acute, SNF and HH; IV, oral antibiotic and dressing changes    Pain Assessment No/denies pain   Wound Properties Date First Assessed: 01/31/15 Time First Assessed: 1530 Wound Type: Puncture Location: Leg Location Orientation: Right;Anterior Present on Admission: Yes   Dressing Type Gauze (Comment);Compression wrap  Colloid medihoney, vaseline perimeter, gauze profore netting   Dressing Changed Changed   Dressing Status Clean;Dry;Intact   Dressing Change Frequency Other  (Comment)  2x/week   Site / Wound Assessment Red;Yellow   % Wound base Red or Granulating 80%   % Wound base Yellow 20%   % Wound base Black 0%   Wound Length (cm) 6 cm   Wound Width (cm) 2.4 cm   Wound Depth (cm) 0 cm   Closure None   Drainage Amount Moderate   Drainage Description Serosanguineous;Purulent   Treatment Cleansed;Debridement (Selective)   Wound Properties Date First Assessed: 01/31/15 Time First Assessed: 1622 Wound Type: Puncture Location: Leg Location Orientation: Left;Anterior   Dressing Type Impregnated gauze (petrolatum);Gauze (Comment);Compression wrap  xeroform, gauze, proform and netting   Dressing Changed Changed   Dressing Status Clean;Dry;Intact   Dressing Change Frequency Other (Comment)  2x week   % Wound base Red or Granulating 100%   % Wound base Yellow 0%   Wound Length (cm) 1.2 cm   Wound Width (cm) 1 cm   Wound Depth (cm) 0 cm   Drainage Amount Minimal   Treatment Cleansed;Debridement (Selective)   Selective Debridement - Location wound bed    Selective Debridement - Tools Used Forceps;Scalpel   Selective Debridement - Tissue Removed slough and dry skin   Wound Therapy - Clinical Statement Wounds continue to progress well with increased granualtion and desceasing in overall wound size.  Continued colloid medihoney for Rt LE and changed to xeroform to promote healing  due to no slough.  Continued with profore for edema control.   Wound Therapy - Functional Problem List painful to walk    Factors Delaying/Impairing Wound Healing Altered sensation;Diabetes Mellitus;Infection - systemic/local;Vascular compromise   Hydrotherapy Plan Debridement;Dressing change   Wound Therapy - Frequency --  2x/week   Wound Therapy - Current Recommendations PT   Wound Plan Pt to be seen twice a week for cleansing, moisurizing, debridement and compression dressing with profore to B LE    Dressing  Rt RU:[id number]:colloid medihoney and profore compression wrap, netting   Dressing  Lt LE: xeroform, gauze, proform and netting         Problem List Patient Active Problem List   Diagnosis Date Noted  . Cough 12/08/2014  . Essential hypertension   . Venous stasis   . Cellulitis 11/12/2014  . Hypertensive urgency 11/12/2014  . Fracture, finger, distal phalanx, open 11/17/2013   Juel Burrowasey Jo Jessi Jessop, PTA  Juel BurrowCockerham, Chole Driver Jo 02/18/2015, 3:01 PM  [facility] [facility] [address] [place], [place], [address] Phone: [phone]   Fax:  [phone]

## 2015-02-21 ENCOUNTER — Ambulatory Visit (HOSPITAL_COMMUNITY): Payer: Commercial Managed Care - HMO | Admitting: Physical Therapy

## 2015-02-21 DIAGNOSIS — S81802D Unspecified open wound, left lower leg, subsequent encounter: Secondary | ICD-10-CM | POA: Diagnosis not present

## 2015-02-21 DIAGNOSIS — T148XXA Other injury of unspecified body region, initial encounter: Secondary | ICD-10-CM

## 2015-02-21 DIAGNOSIS — T148XXD Other injury of unspecified body region, subsequent encounter: Secondary | ICD-10-CM

## 2015-02-21 NOTE — Therapy (Signed)
[facility] [address] [place], [place], [address] Phone: [phone]   Fax:  [phone]  Wound Care Therapy  Patient Details  Name: Scott [name] MRN: [id number] Date of Birth: 09-01-1946 Referring Provider:  Elfredia NevinsFusco, Lawrence, MD  Encounter Date: 02/21/2015      PT End of Session - 02/21/15 1822    Visit Number 7   Number of Visits 12   Date for PT Re-Evaluation 03/02/15   Authorization Type medicare    Authorization - Visit Number 7   Authorization - Number of Visits 10   PT Start Time 1518   PT Stop Time 1608   PT Time Calculation (min) 50 min   Activity Tolerance Patient tolerated treatment well   Behavior [facility] Therapy [facility] tasks assessed/performed      Past Medical History  Diagnosis Date  . Medical history non-contributory     Past Surgical History  Procedure Laterality Date  . Colon surgery    . Colostomy      There were no vitals filed for this visit.  Visit Diagnosis:  Wound healing, delayed  Nonhealing nonsurgical wound                 Wound Therapy - 02/21/15 1816    Subjective Pt with improving gait and decreasing discomfort in his LE's.   Patient and Family Stated Goals wounds to heal    Date of Onset 10/29/14   Prior Treatments acute, SNF and HH; IV, oral antibiotic and dressing changes    Pain Assessment No/denies pain   Wound Properties Date First Assessed: 01/31/15 Time First Assessed: 1530 Wound Type: Puncture Location: Leg Location Orientation: Right;Anterior Present on Admission: Yes   Dressing Type Gauze (Comment);Compression wrap;Silver hydrofiber  Colloid medihoney, vaseline perimeter, gauze profore netting   Dressing Changed Changed   Dressing Status Clean;Dry;Intact   Dressing Change Frequency Other (Comment)  2x/week   Site / Wound Assessment Red;Yellow   % Wound base Red or Granulating 85%   % Wound base Yellow 15%   % Wound base Black 0%   Closure None   Drainage Amount Moderate   Drainage Description Serosanguineous;Purulent   Treatment Cleansed;Debridement (Selective)   Wound Properties Date First Assessed: 01/31/15 Time First Assessed: 1622 Wound Type: Puncture Location: Leg Location Orientation: Left;Anterior   Dressing Type Impregnated gauze (petrolatum);Gauze (Comment);Compression wrap  xeroform, gauze, proform and netting   Dressing Changed Changed   Dressing Status Clean;Dry;Intact   Dressing Change Frequency Other (Comment)  2x week   % Wound base Red or Granulating 100%   % Wound base Yellow 0%   Drainage Amount Minimal   Treatment Cleansed;Debridement (Selective)   Selective Debridement - Location wound bed and perimeter of wound bilataerlly   Selective Debridement - Tools Used Forceps;Scalpel   Selective Debridement - Tissue Removed slough and dry skin   Wound Therapy - Clinical Statement Wounds with good granulation, however macerated edges due to drainage that continues.  Rt distal wound conintues to drain moderately while proximal wound draining minimally.  Changed dressing to silver hydrofiber for distal wound and continued with honey colloid for more proximal one, however cut dressing to size of wound.  Xeroform used on Lt LE.  Both LE's cleansed well and debrided dry skin from perimeter.  Moisturized well with vaseline prior to reapplication of bandages.     Wound Therapy - Functional Problem List painful to walk    Factors Delaying/Impairing Wound Healing Altered sensation;Diabetes Mellitus;Infection -  systemic/local;Vascular compromise   Hydrotherapy Plan Debridement;Dressing change   Wound Therapy - Frequency --  2x/week   Wound Therapy - Current Recommendations PT   Wound Plan Pt to be seen twice a week for cleansing, moisurizing, debridement and compression dressing with profore to B LE    Dressing  Rt XB:JYNWGN silver hydrofiber, proximal colloid medihoney and profore compression wrap, netting   Dressing Lt LE:  xeroform, gauze, proform and netting                              Problem List Patient Active Problem List   Diagnosis Date Noted  . Cough 12/08/2014  . Essential hypertension   . Venous stasis   . Cellulitis 11/12/2014  . Hypertensive urgency 11/12/2014  . Fracture, finger, distal phalanx, open 11/17/2013    [name], PTA/CLT [phone]  02/21/2015, 6:24 PM  [facility] [facility] [address] [place], [place], [address] Phone: [phone]   Fax:  [phone]

## 2015-02-24 ENCOUNTER — Ambulatory Visit (HOSPITAL_COMMUNITY): Payer: Commercial Managed Care - HMO

## 2015-02-24 DIAGNOSIS — T148XXA Other injury of unspecified body region, initial encounter: Secondary | ICD-10-CM

## 2015-02-24 DIAGNOSIS — S81802D Unspecified open wound, left lower leg, subsequent encounter: Secondary | ICD-10-CM | POA: Diagnosis not present

## 2015-02-24 DIAGNOSIS — T148XXD Other injury of unspecified body region, subsequent encounter: Secondary | ICD-10-CM

## 2015-02-24 NOTE — Therapy (Signed)
[place] [facility] [address] [place], [place], [address] Phone: [phone]   Fax:  [phone]  Wound Care Therapy  Patient Details  Name: Scott Matthews MRN: [id number] Date of Birth: 1946/01/15 Referring Provider:  Elfredia Nevins, MD  Encounter Date: 02/24/2015      PT End of Session - 02/24/15 1719    Visit Number 8   Number of Visits 12   Date for PT Re-Evaluation 03/02/15   Authorization Type medicare    Authorization - Visit Number 8   Authorization - Number of Visits 10   PT Start Time 1512   PT Stop Time 1602   PT Time Calculation (min) 50 min   Activity Tolerance Patient tolerated treatment well   Behavior During Therapy [facility] for tasks assessed/performed      Past Medical History  Diagnosis Date  . Medical history non-contributory     Past Surgical History  Procedure Laterality Date  . Colon surgery    . Colostomy      There were no vitals filed for this visit.  Visit Diagnosis:  Wound healing, delayed  Nonhealing nonsurgical wound      Subjective Assessment - 02/24/15 1607    Subjective Pt entered dept ambulating with no AD, no pain just a little soreness on anterior Rt shin   Currently in Pain? No/denies                   Wound Therapy - 02/24/15 1607    Subjective Pt entered dept ambulating with no AD, no pain just a little soreness on anterior Rt shin   Patient and Family Stated Goals wounds to heal    Date of Onset 10/29/14   Prior Treatments acute, SNF and HH; IV, oral antibiotic and dressing changes    Pain Assessment No/denies pain   Wound Properties Date First Assessed: 01/31/15 Time First Assessed: 1530 Wound Type: Puncture Location: Leg Location Orientation: Right;Anterior Present on Admission: Yes   Dressing Type Gauze (Comment);Compression wrap;Silver hydrofiber   Dressing Changed Changed   Dressing Status Clean;Dry;Intact   Dressing Change Frequency Other (Comment)  2x week    Site / Wound Assessment Red;Yellow   % Wound base Red or Granulating 85%   % Wound base Yellow 15%   % Wound base Black 0%   Closure None   Drainage Amount Moderate   Drainage Description Serosanguineous;Purulent   Treatment Cleansed;Debridement (Selective)   Wound Properties Date First Assessed: 01/31/15 Time First Assessed: 1622 Wound Type: Puncture Location: Leg Location Orientation: Left;Anterior   Dressing Type Impregnated gauze (petrolatum);Gauze (Comment);Compression wrap  xeroform, gauze, profore and netting   Dressing Changed Changed   Dressing Status Clean;Dry;Intact   Dressing Change Frequency Other (Comment)  2x weekly   % Wound base Red or Granulating 100%   % Wound base Yellow 0%   Drainage Amount Scant   Treatment Cleansed;Debridement (Selective)   Selective Debridement - Location wound bed and perimeter of wound bilataerlly   Selective Debridement - Tools Used Forceps;Scalpel   Selective Debridement - Tissue Removed slough and dry skin   Wound Therapy - Clinical Statement Wounds continue to improve in granulation with decreased slough following debridement.  Noted improvements with macerated edged due to drainage following change of distal wound to silver hydrofiber.  Continued with colloid medihoney on proximal and silver hydrofiber for distal wound on Rt and xeroform for Lt.   Lt shin improving in decreased size and increased granualtion tissues.  Wound Therapy - Functional Problem List painful to walk    Factors Delaying/Impairing Wound Healing Altered sensation;Diabetes Mellitus;Infection - systemic/local;Vascular compromise   Hydrotherapy Plan Debridement;Dressing change   Wound Therapy - Frequency --  2x/week   Wound Therapy - Current Recommendations PT   Wound Plan Pt to be seen twice a week for cleansing, moisurizing, debridement and compression dressing with profore to B LE    Dressing  Rt MV:HQIONG silver hydrofiber, proximal colloid medihoney and profore  compression wrap, netting   Dressing Lt LE: xeroform, gauze, proform and netting         Problem List Patient Active Problem List   Diagnosis Date Noted  . Cough 12/08/2014  . Essential hypertension   . Venous stasis   . Cellulitis 11/12/2014  . Hypertensive urgency 11/12/2014  . Fracture, finger, distal phalanx, open 11/17/2013   Juel Burrow, PTA  Juel Burrow 02/24/2015, 5:20 PM  [place] [facility] [address] [place], [place], [address] Phone: [phone]   Fax:  [phone]

## 2015-02-28 ENCOUNTER — Ambulatory Visit (HOSPITAL_COMMUNITY): Payer: Commercial Managed Care - HMO | Admitting: Physical Therapy

## 2015-02-28 DIAGNOSIS — S81802D Unspecified open wound, left lower leg, subsequent encounter: Secondary | ICD-10-CM | POA: Diagnosis not present

## 2015-02-28 DIAGNOSIS — T148XXD Other injury of unspecified body region, subsequent encounter: Secondary | ICD-10-CM

## 2015-02-28 DIAGNOSIS — T148XXA Other injury of unspecified body region, initial encounter: Secondary | ICD-10-CM

## 2015-02-28 NOTE — Therapy (Signed)
[place], [place], [address] Phone: [phone]   Fax:  [phone]  Wound Care Therapy  Patient Details  Name: Scott Matthews MRN: [id number] Date of Birth: [birth year] Referring Provider:  Sharilyn Sites, MD  Encounter Date: 02/28/2015      PT End of Session - 02/28/15 1619    Visit Number 9   Number of Visits 12   Date for PT Re-Evaluation 03/02/15   Authorization Type medicare    Authorization - Visit Number 9   Authorization - Number of Visits 10   PT Start Time 3383   PT Stop Time 1610   PT Time Calculation (min) 55 min   Activity Tolerance Patient tolerated treatment well   Behavior During Therapy [facility] for tasks assessed/performed      Past Medical History  Diagnosis Date  . Medical history non-contributory     Past Surgical History  Procedure Laterality Date  . Colon surgery    . Colostomy      There were no vitals filed for this visit.  Visit Diagnosis:  Wound healing, delayed  Nonhealing nonsurgical wound      Subjective Assessment - 02/28/15 1607    Subjective Pt using QC today, however carrying it more than actually using it.  States his legs feel much better.                   Wound Therapy - 02/28/15 1607    Patient and Family Stated Goals wounds to heal    Date of Onset 10/29/14   Prior Treatments acute, SNF and HH; IV, oral antibiotic and dressing changes    Pain Assessment No/denies pain   Wound Properties Date First Assessed: 01/31/15 Time First Assessed: 1530 Wound Type: Puncture Location: Leg Location Orientation: Right;Anterior Present on Admission: Yes   Dressing Type Gauze (Comment);Compression wrap;Impregnated gauze (bismuth)   Dressing Changed Changed   Dressing Status Clean;Dry;Intact   Dressing Change Frequency Other (Comment)  2x week   Site / Wound Assessment Red;Yellow   % Wound base Red or Granulating 95%   % Wound base Yellow 5%   % Wound  base Black 0%   Closure None   Drainage Amount Moderate   Drainage Description Serosanguineous;Purulent   Treatment Cleansed;Debridement (Selective)   Wound Properties Date First Assessed: 01/31/15 Time First Assessed: 1622 Wound Type: Puncture Location: Leg Location Orientation: Left;Anterior   Dressing Type Impregnated gauze (petrolatum);Gauze (Comment);Compression wrap  xeroform, gauze, profore and netting   Dressing Changed Changed   Dressing Status Clean;Dry;Intact   Dressing Change Frequency Other (Comment)  2x weekly   % Wound base Red or Granulating 100%   % Wound base Yellow 0%   Drainage Amount Scant   Treatment Cleansed;Debridement (Selective)   Selective Debridement - Location wound bed and perimeter of wound bilataerlly   Selective Debridement - Tools Used Forceps;Scalpel   Selective Debridement - Tissue Removed slough and dry skin   Wound Therapy - Clinical Statement Wounds continue to approximate with vast improvement in granulation and reduced drainage.  Changed dressings to xeroform for both LE.  Continued profore for Rt LE, however discontinued on Lt due to no swelling and very small area remaining, approx 0.3 X 0.3 cm.  .     Wound Therapy - Functional Problem List painful to walk    Factors Delaying/Impairing Wound Healing Altered sensation;Diabetes Mellitus;Infection - systemic/local;Vascular compromise   Hydrotherapy Plan Debridement;Dressing change, gcodes due next session.  Wound Therapy - Frequency --  2x/week   Wound Therapy - Current Recommendations PT   Wound Plan Pt to be seen twice a week for cleansing, moisurizing, debridement and compression dressing with profore to B LE    Dressing  Rt XL:KGMWNU xeroform, profore, netting   Dressing Lt LE: xeroform, gauze, 3"conform and netting   Decrease Necrotic Tissue to STG:  3 weeks 0%   Decrease Necrotic Tissue - Progress Partly met   Increase Granulation Tissue to TG:  3 weeks 100%   Increase Granulation Tissue  - Progress Partly met   Decrease Length/Width/Depth by (cm) STG:  3cmx 1.5cm; 6weeks 5cm x .5 cm    Decrease Length/Width/Depth - Progress Progressing toward goal   Additional Wound Therapy Goal STG:  Pt pain level to be no greater than a 4/10 ; LTG 6 weeks: PT pain level to be no greater than a 2/10   Additional Wound Therapy Goal - Progress Met                              Problem List Patient Active Problem List   Diagnosis Date Noted  . Cough 12/08/2014  . Essential hypertension   . Venous stasis   . Cellulitis 11/12/2014  . Hypertensive urgency 11/12/2014  . Fracture, finger, distal phalanx, open 11/17/2013    Teena Irani, PTA/CLT [phone]  02/28/2015, 4:20 PM  [place] [address] [place], [place], [address] Phone: [phone]   Fax:  [phone]

## 2015-03-03 ENCOUNTER — Ambulatory Visit (HOSPITAL_COMMUNITY): Payer: Commercial Managed Care - HMO | Admitting: Physical Therapy

## 2015-03-03 DIAGNOSIS — T148XXD Other injury of unspecified body region, subsequent encounter: Secondary | ICD-10-CM

## 2015-03-03 DIAGNOSIS — S81802D Unspecified open wound, left lower leg, subsequent encounter: Secondary | ICD-10-CM | POA: Diagnosis not present

## 2015-03-03 DIAGNOSIS — T148XXA Other injury of unspecified body region, initial encounter: Secondary | ICD-10-CM

## 2015-03-03 NOTE — Therapy (Signed)
[place], [place], [address] Phone: [phone]   Fax:  [phone]  Wound Care Therapy  Patient Details  Name: Scott Matthews MRN: [id number] Date of Birth: 03-Aug-1946 Referring Provider:  Sharilyn Sites, MD  Encounter Date: 03/03/2015      PT End of Session - 03/03/15 1018    Visit Number 10   Number of Visits 20   Date for PT Re-Evaluation 03/31/15   Authorization Type medicare    Authorization - Visit Number 10   Authorization - Number of Visits 20   PT Start Time 1497   PT Stop Time 1005   PT Time Calculation (min) 30 min   Activity Tolerance Patient tolerated treatment well   Behavior During Therapy [facility] for tasks assessed/performed      Past Medical History  Diagnosis Date  . Medical history non-contributory     Past Surgical History  Procedure Laterality Date  . Colon surgery    . Colostomy      There were no vitals filed for this visit.  Visit Diagnosis:  Wound healing, delayed  Nonhealing nonsurgical wound          Wound Therapy - 03/03/15 0938    Subjective PT states the Rt LE bandage got tight and he had to remove a couple layers.  No pain.   Patient and Family Stated Goals wounds to heal    Date of Onset 10/29/14   Prior Treatments acute, SNF and HH; IV, oral antibiotic and dressing changes    Pain Assessment No/denies pain   Wound Properties Date First Assessed: 01/31/15 Time First Assessed: 1530 Wound Type: Puncture Location: Leg Location Orientation: Right;Anterior Present on Admission: Yes   Dressing Type Gauze (Comment);Compression wrap;Impregnated gauze (bismuth)   Dressing Changed Changed   Dressing Status Clean;Dry;Intact   Dressing Change Frequency Other (Comment)  2x week   Site / Wound Assessment Red;Yellow   % Wound base Red or Granulating 95%   % Wound base Yellow 5%   % Wound base Black 0%   Wound Length (cm) --  prox-distal 2.3X1, 2X2, 1X0.5   Wound  Width (cm) --  prox-distal 2.3X1, 2X2, 1X0.5   Wound Depth (cm) --  no depth   Closure None   Drainage Amount Minimal   Drainage Description Serosanguineous;Purulent   Treatment Cleansed;Debridement (Selective)   Wound Properties Date First Assessed: 01/31/15 Time First Assessed: 1622 Wound Type: Puncture Location: Leg Location Orientation: Left;Anterior Final Assessment Date: 03/03/15   Dressing Type --   Dressing Status --   Dressing Change Frequency --   % Wound base Red or Granulating --   % Wound base Yellow --   Drainage Amount --   Selective Debridement - Location wound bed and perimeter Rt LE only   Selective Debridement - Tools Used Forceps;Scalpel   Selective Debridement - Tissue Removed slough and dry skin   Wound Therapy - Clinical Statement Lt LE is now healed with no other intervention needed.  suggested patient keep the area clean and protected.  Rt LE wounds now presents with 3 smaller wounds all without depth.  Most proximal wound only with small amount of slough present.  Other two wounds 100% granulated and approximating well.  Profore dressing is no longer needed.  No edema present   Wound Therapy - Functional Problem List --   Factors Delaying/Impairing Wound Healing Altered sensation;Diabetes Mellitus;Infection - systemic/local;Vascular compromise   Hydrotherapy Plan Debridement;Dressing change   Wound  Therapy - Frequency --  2x/week   Wound Therapy - Current Recommendations PT   Wound Plan continue woundcare to Rt LE 2X week until healed.  discontinue care to [place] as wound is now healed.   Dressing  Rt PZ:PSUGAY xeroform, kerlix, netting   Dressing --   Decrease Necrotic Tissue to STG:  3 weeks 0%   Decrease Necrotic Tissue - Progress Partly met   Increase Granulation Tissue to TG:  3 weeks 100%   Increase Granulation Tissue - Progress Partly met   Decrease Length/Width/Depth by (cm) STG:  3cmx 1.5cm; 6weeks 5cm x .5 cm    Decrease Length/Width/Depth -  Progress Progressing toward goal   Additional Wound Therapy Goal STG:  Pt pain level to be no greater than a 4/10 ; LTG 6 weeks: PT pain level to be no greater than a 2/10   Additional Wound Therapy Goal - Progress Met                G-Codes - 2015/04/02 1628    Functional Limitation Other PT primary   Other PT Primary Current Status (G4720) At least 20 percent but less than 40 percent impaired, limited or restricted   Other PT Primary Goal Status (T2182) At least 1 percent but less than 20 percent impaired, limited or restricted       Problem List Patient Active Problem List   Diagnosis Date Noted  . Cough 12/08/2014  . Essential hypertension   . Venous stasis   . Cellulitis 11/12/2014  . Hypertensive urgency 11/12/2014  . Fracture, finger, distal phalanx, open 11/17/2013    Teena Irani, PTA/CLT [place], PT CLT [phone] 04-02-2015, 4:29 PM  [facility] [address] [place], [place], [address] Phone: [phone]   Fax:  [phone]

## 2015-03-07 ENCOUNTER — Ambulatory Visit (HOSPITAL_COMMUNITY): Payer: Commercial Managed Care - HMO | Admitting: Physical Therapy

## 2015-03-07 ENCOUNTER — Telehealth (HOSPITAL_COMMUNITY): Payer: Self-pay | Admitting: Physical Therapy

## 2015-03-07 DIAGNOSIS — T148XXD Other injury of unspecified body region, subsequent encounter: Secondary | ICD-10-CM

## 2015-03-07 DIAGNOSIS — T148XXA Other injury of unspecified body region, initial encounter: Secondary | ICD-10-CM

## 2015-03-07 DIAGNOSIS — S81802D Unspecified open wound, left lower leg, subsequent encounter: Secondary | ICD-10-CM | POA: Diagnosis not present

## 2015-03-07 NOTE — Therapy (Signed)
[place], [place], [address] Phone: [phone]   Fax:  [phone]  Wound Care Therapy  Patient Details  Name: CUAHUTEMOC ATTAR MRN: [id number] Date of Birth: June 20, 1946 Referring Provider:  Redmond School, MD  Encounter Date: 03/07/2015      PT End of Session - 03/07/15 1627    Visit Number 11   Number of Visits 20   Date for PT Re-Evaluation 03/31/15   Authorization Type medicare    Authorization - Visit Number 11   Authorization - Number of Visits 20   PT Start Time 3159   PT Stop Time 4585   PT Time Calculation (min) 25 min      Past Medical History  Diagnosis Date  . Medical history non-contributory     Past Surgical History  Procedure Laterality Date  . Colon surgery    . Colostomy      There were no vitals filed for this visit.  Visit Diagnosis:  Wound healing, delayed  Nonhealing nonsurgical wound             Wound Therapy - 03/07/15 1619    Subjective PT has no complaints    Patient and Family Stated Goals wounds to heal    Date of Onset 10/29/14   Prior Treatments acute, SNF and HH; IV, oral antibiotic and dressing changes    Pain Assessment No/denies pain   Wound Properties Date First Assessed: 01/31/15 Time First Assessed: 9292 Wound Type: Puncture Location: Leg Location Orientation: Right;Anterior Present on Admission: Yes   Dressing Type Gauze (Comment);Other (Comment)  Coban only for compression    Dressing Changed Changed   Dressing Status Clean;Dry;Intact   Dressing Change Frequency Other (Comment)  2x week   Site / Wound Assessment Red;Yellow   % Wound base Red or Granulating 95%   % Wound base Yellow 5%   % Wound base Black 0%   Wound Length (cm) --  prximal 3x.5; distal 1.8cmx 1.5 cm wound is hypergranulating   Closure None   Drainage Amount Minimal   Drainage Description Serosanguineous;Purulent   Treatment Cleansed;Debridement (Selective)   Selective Debridement - Location wound bed and perimeter Rt LE only   Selective Debridement - Tools Used Forceps;Scalpel   Selective Debridement - Tissue Removed slough and dry skin   Wound Therapy - Clinical Statement Removed scab on LT LE which left small open area which was wrapped with gauze and netting; decreased compression on Rt LE to kling and coban only instead of coban.    Wound Therapy - Functional Problem List PT able to ambulate with minimal discomfort at this time.    Factors Delaying/Impairing Wound Healing Altered sensation;Diabetes Mellitus;Infection - systemic/local;Vascular compromise   Hydrotherapy Plan Debridement;Dressing change   Wound Therapy - Frequency --  2x/week   Wound Therapy - Current Recommendations PT   Wound Plan continue woundcare to Rt LE 2X week until healed.  discontinue care to [place] as wound is now healed.   Dressing  RtRt honey, kerlix,coban and nettting   Decrease Necrotic Tissue to STG:  3 weeks 0%   Decrease Necrotic Tissue - Progress Progressing toward goal   Increase Granulation Tissue to TG:  3 weeks 100%   Increase Granulation Tissue - Progress Progressing toward goal   Decrease Length/Width/Depth by (cm) STG:  3cmx 1.5cm; 6weeks 5cm x .5 cm    Decrease Length/Width/Depth - Progress Progressing toward goal   Additional Wound Therapy Goal STG:  Pt  pain level to be no greater than a 4/10 ; LTG 6 weeks: PT pain level to be no greater than a 2/10   Additional Wound Therapy Goal - Progress Met            Problem List Patient Active Problem List   Diagnosis Date Noted  . Cough 12/08/2014  . Essential hypertension   . Venous stasis   . Cellulitis 11/12/2014  . Hypertensive urgency 11/12/2014  . Fracture, finger, distal phalanx, open 11/17/2013  Rayetta Humphrey, PT CLT [phone] 03/07/2015, 4:28 PM  [facility] [address] [place], [place], [address] Phone: [phone]   Fax:   [phone]

## 2015-03-07 NOTE — Telephone Encounter (Signed)
Called to check on patient and the mail box is full and could not leave a message.

## 2015-03-10 ENCOUNTER — Ambulatory Visit (HOSPITAL_COMMUNITY): Payer: Commercial Managed Care - HMO

## 2015-03-10 ENCOUNTER — Ambulatory Visit (HOSPITAL_COMMUNITY): Payer: Commercial Managed Care - HMO | Admitting: Physical Therapy

## 2015-03-10 DIAGNOSIS — T148XXD Other injury of unspecified body region, subsequent encounter: Secondary | ICD-10-CM

## 2015-03-10 DIAGNOSIS — S81802D Unspecified open wound, left lower leg, subsequent encounter: Secondary | ICD-10-CM | POA: Diagnosis not present

## 2015-03-10 DIAGNOSIS — T148XXA Other injury of unspecified body region, initial encounter: Secondary | ICD-10-CM

## 2015-03-10 NOTE — Therapy (Signed)
[place], [place], [address] Phone: [phone]   Fax:  [phone]  Wound Care Therapy  Patient Details  Name: Scott Matthews MRN: [id number] Date of Birth: [birth year] Referring Provider:  Redmond School, MD  Encounter Date: 03/10/2015      PT End of Session - 03/10/15 1524    Visit Number 12   Number of Visits 20   Date for PT Re-Evaluation 03/31/15   Authorization Type medicare    Authorization - Visit Number 12   Authorization - Number of Visits 20   PT Start Time 3500   PT Stop Time 1510   PT Time Calculation (min) 32 min   Activity Tolerance Patient tolerated treatment well   Behavior During Therapy [facility] for tasks assessed/performed      Past Medical History  Diagnosis Date  . Medical history non-contributory     Past Surgical History  Procedure Laterality Date  . Colon surgery    . Colostomy      There were no vitals filed for this visit.  Visit Diagnosis:  Wound healing, delayed  Nonhealing nonsurgical wound      Subjective Assessment - 03/10/15 1526    Subjective Pt entered dept ambulating with no AD, noted drainage through dressings though intact.  Pain scale 2/10 Rt shin   Currently in Pain? Yes   Pain Score 2    Pain Location Leg   Pain Orientation Right   Pain Descriptors / Indicators Aching                   Wound Therapy - 03/10/15 1526    Subjective Pt entered dept ambulating with no AD, noted drainage through dressings though intact.  Pain scale 2/10 Rt shin   Patient and Family Stated Goals wounds to heal    Date of Onset 10/29/14   Prior Treatments acute, SNF and HH; IV, oral antibiotic and dressing changes    Wound Properties Date First Assessed: 01/31/15 Time First Assessed: 9381 Wound Type: Puncture Location: Leg Location Orientation: Right;Anterior Present on Admission: Yes   Dressing Type --  Medihoney proximal, hydrofiber distal, 4x4, gauze,  coban net   Dressing Changed Changed   Dressing Status Clean;Dry;Intact   Dressing Change Frequency Other (Comment)   Site / Wound Assessment Red;Yellow   % Wound base Red or Granulating 95%   % Wound base Yellow 5%   % Wound base Black 0%   Closure None   Drainage Amount Minimal   Drainage Description Serosanguineous;Purulent   Treatment Cleansed;Debridement (Selective)   Selective Debridement - Location wound bed and perimeter Rt LE only   Selective Debridement - Tools Used Forceps;Scalpel   Selective Debridement - Tissue Removed slough and dry skin   Wound Therapy - Clinical Statement Drainage through dressings Rt LE with noted increased edema, switched to hydrofiber on distal wound and continued with colloid medihoney proximal wound to assist with drainage.  Continued with gauze, coban and netting for edema control.  Lt LE continues to have small opening, cleansed and wrapped with gauze and netting.  No reports of increased pain at end of session.     Wound Therapy - Functional Problem List PT able to ambulate with minimal discomfort at this time.    Factors Delaying/Impairing Wound Healing Altered sensation;Diabetes Mellitus;Infection - systemic/local;Vascular compromise   Hydrotherapy Plan Debridement;Dressing change   Wound Therapy - Current Recommendations PT   Wound Plan continue woundcare to Rt LE 2X  week until healed.  discontinue care to [name] as wound is now healed.   Dressing  Rt Colloid medihoney proximal, hydrofiber distal, 4x4, gauze, coban net   Decrease Necrotic Tissue to STG:  3 weeks 0%   Decrease Necrotic Tissue - Progress Progressing toward goal   Increase Granulation Tissue to TG:  3 weeks 100%   Increase Granulation Tissue - Progress Progressing toward goal   Decrease Length/Width/Depth by (cm) STG:  3cmx 1.5cm; 6weeks 5cm x .5 cm    Decrease Length/Width/Depth - Progress Progressing toward goal   Additional Wound Therapy Goal STG:  Pt pain level to be no greater  than a 4/10 ; LTG 6 weeks: PT pain level to be no greater than a 2/10   Additional Wound Therapy Goal - Progress Met          Problem List Patient Active Problem List   Diagnosis Date Noted  . Cough 12/08/2014  . Essential hypertension   . Venous stasis   . Cellulitis 11/12/2014  . Hypertensive urgency 11/12/2014  . Fracture, finger, distal phalanx, open 11/17/2013   Aldona Lento, PTA  Aldona Lento 03/10/2015, 5:51 PM  [facility] [address] [place], [place], [address] Phone: [phone]   Fax:  [phone]

## 2015-03-15 ENCOUNTER — Ambulatory Visit (HOSPITAL_COMMUNITY): Payer: Commercial Managed Care - HMO | Admitting: Physical Therapy

## 2015-03-15 DIAGNOSIS — T148XXD Other injury of unspecified body region, subsequent encounter: Secondary | ICD-10-CM

## 2015-03-15 DIAGNOSIS — T148XXA Other injury of unspecified body region, initial encounter: Secondary | ICD-10-CM

## 2015-03-15 DIAGNOSIS — S81802D Unspecified open wound, left lower leg, subsequent encounter: Secondary | ICD-10-CM | POA: Diagnosis not present

## 2015-03-15 NOTE — Therapy (Signed)
[facility] [address] [place], [place], [address] Phone: [phone]   Fax:  [phone]  Wound Care Therapy  Patient Details  Name: Scott CaptainWaymon C Marcotte MRN: [id number] Date of Birth: 1946/01/22 Referring Provider:  Assunta FoundGolding, John, MD  Encounter Date: 03/15/2015    Past Medical History  Diagnosis Date  . Medical history non-contributory     Past Surgical History  Procedure Laterality Date  . Colon surgery    . Colostomy      There were no vitals filed for this visit.  Visit Diagnosis:  Wound healing, delayed  Nonhealing nonsurgical wound                 Wound Therapy - 03/15/15 1037    Subjective Pt reports no changes or complaints.  No pain.   Patient and Family Stated Goals wounds to heal    Date of Onset 10/29/14   Prior Treatments acute, SNF and HH; IV, oral antibiotic and dressing changes    Wound Properties Date First Assessed: 01/31/15 Time First Assessed: 1530 Wound Type: Puncture Location: Leg Location Orientation: Right;Anterior Present on Admission: Yes   Dressing Type Gauze (Comment);Other (Comment);Impregnated gauze (bismuth)  xeroform, 4x4, gauze, coban net   Dressing Changed Changed   Dressing Status Clean;Dry;Intact   Dressing Change Frequency Other (Comment)   Site / Wound Assessment Red;Yellow   % Wound base Red or Granulating 95%   % Wound base Yellow 5%   % Wound base Black 0%   Closure None   Drainage Amount Minimal   Drainage Description Serosanguineous;Purulent   Treatment Cleansed;Debridement (Selective)   Selective Debridement - Location wound bed and perimeter Rt LE only   Selective Debridement - Tools Used Forceps   Selective Debridement - Tissue Removed slough and dry skin   Wound Therapy - Clinical Statement Not as much drainage today with dressing stuck to bandage.  Changed back to xeroform to promote increased moisture.  Pt with loose dressing over Lt shin wound just to  protect area.  No drainage when removed dressing, however small tick was crawling on skin inside bandage.  Pt made aware and tick discarded in tape.  No bites or irritation noted.  Continued with loose dressing over healed Lt shin wound only for protection.    Wound Therapy - Functional Problem List PT able to ambulate with minimal discomfort at this time.    Factors Delaying/Impairing Wound Healing Altered sensation;Diabetes Mellitus;Infection - systemic/local;Vascular compromise   Hydrotherapy Plan Debridement;Dressing change   Wound Therapy - Current Recommendations PT   Wound Plan continue woundcare to Rt LE 2X week until healed, changing dressings as needed.    Dressing  Xeroform, 4x4, gauze, coban net   Decrease Necrotic Tissue to STG:  3 weeks 0%   Increase Granulation Tissue to TG:  3 weeks 100%   Decrease Length/Width/Depth by (cm) STG:  3cmx 1.5cm; 6weeks 5cm x .5 cm    Additional Wound Therapy Goal STG:  Pt pain level to be no greater than a 4/10 ; LTG 6 weeks: PT pain level to be no greater than a 2/10                 Problem List Patient Active Problem List   Diagnosis Date Noted  . Cough 12/08/2014  . Essential hypertension   . Venous stasis   . Cellulitis 11/12/2014  . Hypertensive urgency 11/12/2014  . Fracture, finger, distal phalanx, open 11/17/2013    Lurena NidaAmy B Adekunle Rohrbach,  PTA/CLT [phone]  03/15/2015, 11:29 AM  [place] [facility] [address] [place], [place], [address] Phone: [phone]   Fax:  [phone]

## 2015-03-17 ENCOUNTER — Ambulatory Visit (HOSPITAL_COMMUNITY): Payer: Commercial Managed Care - HMO | Attending: Family Medicine | Admitting: Physical Therapy

## 2015-03-17 DIAGNOSIS — S81802D Unspecified open wound, left lower leg, subsequent encounter: Secondary | ICD-10-CM | POA: Diagnosis not present

## 2015-03-17 DIAGNOSIS — T148XXA Other injury of unspecified body region, initial encounter: Secondary | ICD-10-CM

## 2015-03-17 DIAGNOSIS — T148XXD Other injury of unspecified body region, subsequent encounter: Secondary | ICD-10-CM

## 2015-03-17 NOTE — Therapy (Signed)
[name] [address] [place], [place], [address] Phone: [phone]   Fax:  [phone]  Wound Care Therapy  Patient Details  Name: Scott Matthews MRN: [id number] Date of Birth: 1945-11-21 Referring Provider:  Elfredia NevinsFusco, Lawrence, MD  Encounter Date: 03/17/2015      PT End of Session - 03/17/15 1057    Visit Number 12   Number of Visits 20   Date for PT Re-Evaluation 03/31/15   Authorization Type medicare    Authorization - Visit Number 12   Authorization - Number of Visits 20   PT Start Time 1018   PT Stop Time 1042   PT Time Calculation (min) 24 min      Past Medical History  Diagnosis Date  . Medical history non-contributory     Past Surgical History  Procedure Laterality Date  . Colon surgery    . Colostomy      There were no vitals filed for this visit.  Visit Diagnosis:  Wound healing, delayed  Nonhealing nonsurgical wound                 Wound Therapy - 03/17/15 1053    Subjective Pt reports no changes or complaints.  No pain.   Patient and Family Stated Goals wounds to heal    Date of Onset 10/29/14   Prior Treatments acute, SNF and HH; IV, oral antibiotic and dressing changes    Pain Assessment No/denies pain   Wound Properties Date First Assessed: 01/31/15 Time First Assessed: 1530 Wound Type: Puncture Location: Leg Location Orientation: Right;Anterior Present on Admission: Yes   Dressing Type Gauze (Comment);Other (Comment);Impregnated gauze (bismuth)  xeroform, 4x4, gauze, coban net   Dressing Changed Changed   Dressing Status Clean;Dry;Intact   Dressing Change Frequency Other (Comment)   Site / Wound Assessment Red;Yellow   % Wound base Red or Granulating 95%   % Wound base Yellow 5%   % Wound base Black 0%   Closure None   Drainage Amount Scant   Drainage Description Serous   Treatment Cleansed;Debridement (Selective)   Selective Debridement - Location wound bed and perimeter Rt  LE only   Selective Debridement - Tools Used Forceps   Selective Debridement - Tissue Removed slough and dry skin   Wound Therapy - Clinical Statement wound doing well with xeroform.  Only minimal slough remaining in smaller proximal wound.  Lt LE healed without no further intervention needed.  Conntinued with xeroform to woundbed and use of compression.     Wound Therapy - Functional Problem List PT able to ambulate with minimal discomfort at this time.    Factors Delaying/Impairing Wound Healing Altered sensation;Diabetes Mellitus;Infection - systemic/local;Vascular compromise   Hydrotherapy Plan Debridement;Dressing change   Wound Therapy - Current Recommendations PT   Wound Plan continue woundcare to Rt LE 2X week until healed.  discontinue care to Lt LE as wound is now healed.   Dressing  xeroform, 4X4, kerlix and coban   Decrease Necrotic Tissue to STG:  3 weeks 0%   Increase Granulation Tissue to TG:  3 weeks 100%   Decrease Length/Width/Depth by (cm) STG:  3cmx 1.5cm; 6weeks 5cm x .5 cm    Additional Wound Therapy Goal STG:  Pt pain level to be no greater than a 4/10 ; LTG 6 weeks: PT pain level to be no greater than a 2/10  Problem List Patient Active Problem List   Diagnosis Date Noted  . Cough 12/08/2014  . Essential hypertension   . Venous stasis   . Cellulitis 11/12/2014  . Hypertensive urgency 11/12/2014  . Fracture, finger, distal phalanx, open 11/17/2013   [name], PTA/CLT [phone]  03/17/2015, 10:59 AM  [facility] [address] [place], [place], [address] Phone: [phone]   Fax:  [phone]

## 2015-03-23 ENCOUNTER — Ambulatory Visit (HOSPITAL_COMMUNITY): Payer: Commercial Managed Care - HMO | Attending: Family Medicine

## 2015-03-23 DIAGNOSIS — L03115 Cellulitis of right lower limb: Secondary | ICD-10-CM | POA: Insufficient documentation

## 2015-03-23 DIAGNOSIS — T148 Other injury of unspecified body region: Secondary | ICD-10-CM | POA: Insufficient documentation

## 2015-03-23 DIAGNOSIS — T148XXD Other injury of unspecified body region, subsequent encounter: Secondary | ICD-10-CM

## 2015-03-23 DIAGNOSIS — T148XXA Other injury of unspecified body region, initial encounter: Secondary | ICD-10-CM

## 2015-03-23 NOTE — Therapy (Signed)
[place] [facility] [address] [place], [place], [address] Phone: [phone]   Fax:  [phone]  Physical Therapy Treatment  Patient Details  Name: CLIFFTON SPRADLEY MRN: [id number] Date of Birth: [birth year] Referring Provider:  Assunta Found, MD  Encounter Date: 03/23/2015      PT End of Session - 03/23/15 0923    Visit Number 13   Number of Visits 20   Date for PT Re-Evaluation 03/31/15   Authorization Type medicare    Authorization - Visit Number 13   Authorization - Number of Visits 20   PT Start Time 0847   PT Stop Time 0915   PT Time Calculation (min) 28 min   Activity Tolerance Patient tolerated treatment well   Behavior During Therapy [facility] for tasks assessed/performed      Past Medical History  Diagnosis Date  . Medical history non-contributory     Past Surgical History  Procedure Laterality Date  . Colon surgery    . Colostomy      There were no vitals filed for this visit.  Visit Diagnosis:  Wound healing, delayed  Nonhealing nonsurgical wound      Subjective Assessment - 03/23/15 0916    Subjective Pt entered dept stateing leg felt good.  No reports of pan today.  Pt is concerned with 1 new spot on Lt LE   Currently in Pain? No/denies              Wound Therapy - 03/23/15 0917    Subjective Pt entered dept stateing leg felt good.  No reports of pan today.  Pt is concerned with 1 new spot on Lt LE   Patient and Family Stated Goals wounds to heal    Date of Onset 10/29/14   Prior Treatments acute, SNF and HH; IV, oral antibiotic and dressing changes    Pain Assessment No/denies pain   Wound Properties Date First Assessed: 01/31/15 Time First Assessed: 1530 Wound Type: Puncture Location: Leg Location Orientation: Right;Anterior Present on Admission: Yes   Dressing Type Impregnated gauze (bismuth);Gauze (Comment);Compression wrap  xeroform, 4x4, vaseline perimeter, gauze and coban   Dressing Changed  Changed   Dressing Status Clean;Dry;Intact   Dressing Change Frequency Other (Comment)  2x/week   Site / Wound Assessment Red;Yellow   % Wound base Red or Granulating 95%   % Wound base Yellow 5%   % Wound base Black 0%   Wound Length (cm) 1.1 cm  distal Rt LE:    Wound Width (cm) 1.5 cm  Distal wound Rt LE   Wound Depth (cm) 0 cm   Closure None   Drainage Amount Scant   Drainage Description Serous   Treatment Cleansed;Debridement (Selective)   Selective Debridement - Location wound bed and perimeter Rt LE only   Selective Debridement - Tools Used Forceps   Selective Debridement - Tissue Removed slough and dry skin   Wound Therapy - Clinical Statement Wound doing well with xeroform, increased approximation and minimal slough to remova.  Lt LE fully healed no intervention required, pt does have small red spot Lt anterior shin, no treatment complete, pt advised to keep his eye on spot.     Wound Therapy - Functional Problem List PT able to ambulate with minimal discomfort at this time.    Factors Delaying/Impairing Wound Healing Altered sensation;Diabetes Mellitus;Infection - systemic/local;Vascular compromise   Hydrotherapy Plan Debridement;Dressing change   Wound Therapy - Frequency --  2x/week   Wound Therapy - Current  Recommendations PT   Wound Plan continue woundcare to Rt LE 2X week until healed.  discontinue care to Lt LE as wound is now healed.        Problem List Patient Active Problem List   Diagnosis Date Noted  . Cough 12/08/2014  . Essential hypertension   . Venous stasis   . Cellulitis 11/12/2014  . Hypertensive urgency 11/12/2014  . Fracture, finger, distal phalanx, open 11/17/2013   Juel Burrowasey Jo Patric Buckhalter, PTA  Juel BurrowCockerham, Daionna Crossland Jo 03/23/2015, 9:24 AM  [place] [facility] [address] [place], [place], [address] Phone: [phone]   Fax:  [phone]

## 2015-03-25 ENCOUNTER — Ambulatory Visit (HOSPITAL_COMMUNITY): Payer: Commercial Managed Care - HMO

## 2015-03-25 DIAGNOSIS — L03115 Cellulitis of right lower limb: Secondary | ICD-10-CM | POA: Diagnosis not present

## 2015-03-25 DIAGNOSIS — T148 Other injury of unspecified body region: Secondary | ICD-10-CM | POA: Diagnosis not present

## 2015-03-25 DIAGNOSIS — T148XXA Other injury of unspecified body region, initial encounter: Secondary | ICD-10-CM

## 2015-03-25 DIAGNOSIS — T148XXD Other injury of unspecified body region, subsequent encounter: Secondary | ICD-10-CM

## 2015-03-25 NOTE — Therapy (Signed)
[facility] [address] , [place], [address] Phone: [phone]   Fax:  [phone]  Wound Care Therapy  Patient Details  Name: Arthor CaptainWaymon C Spink MRN: [id number] Date of Birth: 11/17/1945 Referring Provider:  Assunta FoundGolding, John, MD  Encounter Date: 03/25/2015      PT End of Session - 03/25/15 1425    Visit Number 14   Number of Visits 20   Date for PT Re-Evaluation 03/31/15   Authorization Type medicare    Authorization - Visit Number 14   Authorization - Number of Visits 20   PT Start Time 1347   PT Stop Time 1420   PT Time Calculation (min) 33 min   Activity Tolerance Patient tolerated treatment well   Behavior During Therapy [facility] for tasks assessed/performed      Past Medical History  Diagnosis Date  . Medical history non-contributory     Past Surgical History  Procedure Laterality Date  . Colon surgery    . Colostomy      There were no vitals filed for this visit.  Visit Diagnosis:  Wound healing, delayed  Nonhealing nonsurgical wound      Subjective Assessment - 03/25/15 1421    Subjective Pt entered dept with improve gait mechancis and speed, stated he felt good today.  Pt is still concerned with 1 new spot on Lt LE.   Currently in Pain? No/denies                   Wound Therapy - 03/25/15 1422    Subjective Pt entered dept with improve gait mechancis and speed, stated he felt good today.  Pt is still concerned with 1 new spot on Lt LE.   Patient and Family Stated Goals wounds to heal    Date of Onset 10/29/14   Prior Treatments acute, SNF and HH; IV, oral antibiotic and dressing changes    Pain Assessment No/denies pain   Wound Properties Date First Assessed: 01/31/15 Time First Assessed: 1530 Wound Type: Puncture Location: Leg Location Orientation: Right;Anterior Present on Admission: Yes   Dressing Type Impregnated gauze (bismuth);Gauze (Comment);Compression wrap  xeroform, 4x4, vaseline  perimeter of wound, gauze, coban net   Dressing Changed Changed   Dressing Status Clean;Dry;Intact   Dressing Change Frequency Other (Comment)   Site / Wound Assessment Red;Granulation tissue   % Wound base Red or Granulating 100%   % Wound base Yellow 0%   Closure None   Drainage Amount Scant   Drainage Description Serous   Treatment Cleansed;Debridement (Selective)   Selective Debridement - Location wound bed and perimeter Rt LE only   Selective Debridement - Tools Used Forceps   Selective Debridement - Tissue Removed slough and dry skin   Wound Therapy - Clinical Statement Noted hypergranulation on distal wound Rt LE, increased compression over wound with gauze to assist with the hypergranulation.  Continued with xeroform, gauze and coban for edema control.  No reports of pain through session.   Wound Therapy - Functional Problem List PT able to ambulate with minimal discomfort at this time.    Factors Delaying/Impairing Wound Healing Altered sensation;Diabetes Mellitus;Infection - systemic/local;Vascular compromise   Hydrotherapy Plan Debridement;Dressing change   Wound Therapy - Frequency --  2x week   Wound Therapy - Current Recommendations PT   Wound Plan continue woundcare to Rt LE 2X week until healed.  discontinue care to Lt LE as wound is now healed.   Dressing  xeroform, 4X4, kerlix and  coban        Problem List Patient Active Problem List   Diagnosis Date Noted  . Cough 12/08/2014  . Essential hypertension   . Venous stasis   . Cellulitis 11/12/2014  . Hypertensive urgency 11/12/2014  . Fracture, finger, distal phalanx, open 11/17/2013   Becky Sax, LPTA; CBIS [phone]  Juel Burrow 03/25/2015, 2:26 PM  [place] [facility] [address] [place], [place], [address] Phone: [phone]   Fax:  [phone]

## 2015-03-28 ENCOUNTER — Ambulatory Visit (HOSPITAL_COMMUNITY): Payer: Commercial Managed Care - HMO | Admitting: Physical Therapy

## 2015-03-28 DIAGNOSIS — L03115 Cellulitis of right lower limb: Secondary | ICD-10-CM | POA: Diagnosis not present

## 2015-03-28 DIAGNOSIS — T148XXD Other injury of unspecified body region, subsequent encounter: Secondary | ICD-10-CM

## 2015-03-28 DIAGNOSIS — T148 Other injury of unspecified body region: Secondary | ICD-10-CM | POA: Diagnosis not present

## 2015-03-28 DIAGNOSIS — T148XXA Other injury of unspecified body region, initial encounter: Secondary | ICD-10-CM

## 2015-03-28 NOTE — Therapy (Signed)
[place], [place], [address] Phone: [phone]   Fax:  [phone]  Wound Care Therapy  Patient Details  Name: Scott Matthews MRN: [id number] Date of Birth: [birth year] Referring Provider:  Sharilyn Sites, MD  Encounter Date: 03/28/2015      PT End of Session - 03/28/15 1636    Visit Number 15   Number of Visits 15   Authorization Type medicare    Authorization - Visit Number 15   Authorization - Number of Visits 5   PT Start Time 9326   PT Stop Time 1439   PT Time Calculation (min) 24 min   Activity Tolerance Patient tolerated treatment well      Past Medical History  Diagnosis Date  . Medical history non-contributory     Past Surgical History  Procedure Laterality Date  . Colon surgery    . Colostomy      There were no vitals filed for this visit.  Visit Diagnosis:  Wound healing, delayed  Nonhealing nonsurgical wound             Wound Therapy - 03/28/15 1629    Subjective Pt has no complaints    Patient and Family Stated Goals wound to heall   Date of Onset 10/29/14   Prior Treatments acute, SNF and HH; IV, oral antibiotic and dressing changes    Pain Assessment No/denies pain   Wound Properties Date First Assessed: 01/31/15 Time First Assessed: 7124 Wound Type: Puncture Location: Leg Location Orientation: Right;Anterior Present on Admission: Yes   Dressing Type --  took compression dressing off. Pt left with bandaid on wound   Dressing Changed Other (Comment)   Dressing Status Clean;Old drainage   Dressing Change Frequency --  instructed pt to wash and place new bandaid on area daily.   Site / Wound Assessment Red   % Wound base Red or Granulating 100%   % Wound base Yellow 0%   Wound Length (cm) 0.9 cm   Wound Width (cm) 0.9 cm   Closure None   Drainage Amount Scant   Drainage Description Serous   Treatment Cleansed   Wound Therapy - Clinical Statement Pt wound  appears as a small abrasion at this time.  Therapist discussed with pt care needed for area and that he was no longer in need of skilled therapy  to take care of the wound.  All questions were answered.  Pt verbalized agreement.    Wound Therapy - Functional Problem List no limitation at this time    Factors Delaying/Impairing Wound Healing Altered sensation;Diabetes Mellitus;Infection - systemic/local;Vascular compromise   Hydrotherapy Plan Other (comment)  discharge pt to self care.    Wound Plan Discontinue skilled therapy pt to complete wound care on own.    Dressing  bandaid   Decrease Necrotic Tissue to STG:  3 weeks 0%   Decrease Necrotic Tissue - Progress Met   Increase Granulation Tissue to TG:  3 weeks 100%   Increase Granulation Tissue - Progress Met   Decrease Length/Width/Depth by (cm) STG:  3cmx 1.5cm; 6weeks 5cm x .5 cm    Decrease Length/Width/Depth - Progress Met   Additional Wound Therapy Goal STG:  Pt pain level to be no greater than a 4/10 ; LTG 6 weeks: PT pain level to be no greater than a 2/10   Additional Wound Therapy Goal - Progress Met  G-Codes - 03/28/15 1637    Functional Assessment Tool Used %yellow   Functional Limitation Other PT primary   Other PT Primary Goal Status (R6788) At least 1 percent but less than 20 percent impaired, limited or restricted   Other PT Primary Discharge Status (B3388) 0 percent impaired, limited or restricted       Problem List Patient Active Problem List   Diagnosis Date Noted  . Cough 12/08/2014  . Essential hypertension   . Venous stasis   . Cellulitis 11/12/2014  . Hypertensive urgency 11/12/2014  . Fracture, finger, distal phalanx, open 11/17/2013    Rayetta Humphrey, PT CLT [phone] 03/28/2015, 4:38 PM  [place] [address] [place], [place], [address] Phone: [phone]   Fax:  [phone]   PHYSICAL THERAPY DISCHARGE  SUMMARY  Plan: Patient agrees to discharge.  Patient goals were met. Patient is being discharged due to meeting the stated rehab goals.  ?????      Rayetta Humphrey, [place] CLT [phone]

## 2015-03-30 ENCOUNTER — Ambulatory Visit (HOSPITAL_COMMUNITY): Payer: Commercial Managed Care - HMO | Admitting: Physical Therapy

## 2015-03-30 ENCOUNTER — Ambulatory Visit (HOSPITAL_COMMUNITY): Payer: PRIVATE HEALTH INSURANCE | Admitting: Physical Therapy

## 2015-03-30 ENCOUNTER — Telehealth (HOSPITAL_COMMUNITY): Payer: Self-pay | Admitting: Physical Therapy

## 2015-03-30 DIAGNOSIS — L03115 Cellulitis of right lower limb: Secondary | ICD-10-CM

## 2015-03-30 DIAGNOSIS — T148XXD Other injury of unspecified body region, subsequent encounter: Secondary | ICD-10-CM

## 2015-03-30 DIAGNOSIS — T148XXA Other injury of unspecified body region, initial encounter: Secondary | ICD-10-CM

## 2015-03-30 DIAGNOSIS — T148 Other injury of unspecified body region: Secondary | ICD-10-CM | POA: Diagnosis not present

## 2015-03-30 NOTE — Telephone Encounter (Signed)
Requested antibiotic for [name]'s wound per Donnamae Judeindy Russell. Lawson FiscalLori said she would put in the request and for patient to check with his local [facility] this afternoon. Lawson FiscalLori stated there should be no problem since we saw the patient today.

## 2015-03-30 NOTE — Therapy (Signed)
[facility] [address] [place], [place], [address] Phone: [phone]   Fax:  [phone]  Wound Care Evaluation  Patient Details  Name: Scott Matthews MRN: [id number] Date of Birth: 30-Jun-1946 Referring Provider:  Assunta Found, MD  Encounter Date: 03/30/2015      PT End of Session - 03/30/15 1412    Visit Number 1   Number of Visits 12   Date for PT Re-Evaluation 04/29/15   Authorization Type medicare    Authorization - Visit Number 1   Authorization - Number of Visits 12   PT Start Time 1300   PT Stop Time 1340   PT Time Calculation (min) 40 min      Past Medical History  Diagnosis Date  . Medical history non-contributory     Past Surgical History  Procedure Laterality Date  . Colon surgery    . Colostomy      There were no vitals filed for this visit.  Visit Diagnosis:  Cellulitis of leg, right  Nonhealing nonsurgical wound  Wound healing, delayed         Wound Therapy - 03/30/15 1341    Subjective Mr. Chaikin comes in for treatment stating that the evening after discharge his Rt LE started swelling and then he noticed  drainage coming from his legs    Patient and Family Stated Goals wound to heall   Date of Onset 10/29/14   Prior Treatments acute, SNF and HH; IV, oral antibiotic and dressing changes.  Discharged from OP on 03/28/2015    Pain Assessment 0-10   Pain Score 5    Pain Type Acute pain   Pain Location Leg   Pain Orientation Right;Anterior   Pain Onset Progressive   Patients Stated Pain Goal 0   Pain Intervention(s) Refused   Evaluation and Treatment Procedures Explained to Patient/Family Yes   Evaluation and Treatment Procedures agreed to   Wound Properties Date First Assessed: 03/30/15 Wound Type: Other (Comment) , cellulitis  Location: Leg Location Orientation: Right Wound Description (Comments): anterior shin Present on Admission: Yes   Dressing Type Compression wrap  applied  honeygel to wounds followed by 4x4 and proforelit   Dressing Changed Changed   Dressing Status Old drainage   Dressing Change Frequency Other (Comment)  will use compression and change twice a week    Site / Wound Assessment Black;Friable;Granulation tissue;Painful   % Wound base Red or Granulating --  50% of wounds are granulated; 50%   % Wound base Other (Comment) --  50% covered with dried blood appears black but is not eschar   Peri-wound Assessment Bleeding;Edema;Erythema (blanchable)   Wound Length (cm) 14 cm  area of increased redness and warmth with >15 small wounds    Wound Width (cm) 14 cm   Drainage Amount Minimal   Drainage Description Sanguineous   Treatment Cleansed;Pressure applied   Wound Properties Date First Assessed: 01/31/15 Time First Assessed: 1530 Wound Type: Puncture Location: Leg Location Orientation: Right;Anterior Present on Admission: Yes   Wound Therapy - Clinical Statement Mr. Michie has been a wound patient at this facility and was discharged on 7/11 due to his wound only being 1x1 cm.  He comes back today with his LE red, swollen and painful with multiple small areas that are open and draining.  He was advised to go to his MD as the therapist feels he has cellulitis and needs an antibiotic.  He went to the MD and was told to  return to this clinic and antibiotic will be called in.  His non-healing wound that we were seeing him for was caused in February of this year when he was cutting would.  He has a hospital stay with IV antibiotic, a SNF stay oral antibiotic and Home health with the wound not healing.     Wound Therapy - Functional Problem List painful to ambulate   Factors Delaying/Impairing Wound Healing Altered sensation;Diabetes Mellitus;Infection - systemic/local;Vascular compromise   Hydrotherapy Plan Debridement;Dressing change;Patient/family education   Wound Therapy - Current Recommendations PT   Wound Plan See pt twice a week until wounds are  completely healed or 6 weeks.     Dressing  honeygel to wounds,vaseline to intact skin; proforelite for compression.    Decrease Necrotic Tissue to ST 2 weeks: : 0%   Decrease Necrotic Tissue - Progress Goal set today   Increase Granulation Tissue to STG 2 weeks:  100%   Increase Granulation Tissue - Progress Goal set today   Decrease Length/Width/Depth by (cm) --   2 weeksSTG:  Redness decreased 7x7 cm; LTG: no redness    Decrease Length/Width/Depth - Progress Goal set today   Improve Drainage Characteristics Other (comment)  STG: 1 week scant;  LTG: 3 weeks none   Additional Wound Therapy Goal STG: 1 week no pain.   Goals/treatment plan/discharge plan were made with and agreed upon by patient/family Yes   Time For Goal Achievement --  6 weeks    Wound Therapy - Potential for Goals Good                 G-Codes - 03/30/15 1413    Functional Assessment Tool Used redness # of wounds   Functional Limitation Other PT primary   Other PT Primary Current Status (Z6109(G8990) At least 40 percent but less than 60 percent impaired, limited or restricted   Other PT Primary Goal Status (U0454(G8991) At least 1 percent but less than 20 percent impaired, limited or restricted      Problem List Patient Active Problem List   Diagnosis Date Noted  . Cough 12/08/2014  . Essential hypertension   . Venous stasis   . Cellulitis 11/12/2014  . Hypertensive urgency 11/12/2014  . Fracture, finger, distal phalanx, open 11/17/2013    Virgina Organynthia Russell, PT CLT [phone] 03/30/2015, 2:15 PM  [facility] [address] [place], [place], [address] Phone: [phone]   Fax:  [phone]

## 2015-04-01 ENCOUNTER — Ambulatory Visit (HOSPITAL_COMMUNITY): Payer: Commercial Managed Care - HMO

## 2015-04-01 DIAGNOSIS — L03115 Cellulitis of right lower limb: Secondary | ICD-10-CM | POA: Diagnosis not present

## 2015-04-01 DIAGNOSIS — T148 Other injury of unspecified body region: Secondary | ICD-10-CM | POA: Diagnosis not present

## 2015-04-01 DIAGNOSIS — T148XXA Other injury of unspecified body region, initial encounter: Secondary | ICD-10-CM

## 2015-04-01 DIAGNOSIS — T148XXD Other injury of unspecified body region, subsequent encounter: Secondary | ICD-10-CM

## 2015-04-01 NOTE — Therapy (Signed)
[facility] [address] [place], [place], [address] Phone: [phone]   Fax:  [phone]  Wound Care Therapy  Patient Details  Name: Scott Matthews MRN: [id number] Date of Birth: [birth year] Referring Provider:  Assunta Found, MD  Encounter Date: 04/01/2015      PT End of Session - 04/01/15 1321    Visit Number 2   Number of Visits 12   Date for PT Re-Evaluation 04/29/15   Authorization Type medicare    Authorization - Visit Number 2   Authorization - Number of Visits 12   PT Start Time 1155   PT Stop Time 1233   PT Time Calculation (min) 38 min   Activity Tolerance Patient tolerated treatment well   Behavior During Therapy [facility] for tasks assessed/performed      Past Medical History  Diagnosis Date  . Medical history non-contributory     Past Surgical History  Procedure Laterality Date  . Colon surgery    . Colostomy      There were no vitals filed for this visit.  Visit Diagnosis:  Cellulitis of leg, right  Nonhealing nonsurgical wound  Wound healing, delayed      Subjective Assessment - 04/01/15 1310    Subjective Dressing intact, no reports of pain, on antibiotics for 14 days.   Currently in Pain? No/denies                   Wound Therapy - 04/01/15 1311    Subjective Dressing intact, no reports of pain, on antibiotics for 14 days.   Patient and Family Stated Goals wound to heall   Date of Onset 10/29/14   Prior Treatments acute, SNF and HH; IV, oral antibiotic and dressing changes.  Discharged from OP on 03/28/2015    Pain Assessment No/denies pain   Evaluation and Treatment Procedures Explained to Patient/Family Yes   Evaluation and Treatment Procedures agreed to   Wound Properties Date First Assessed: 03/30/15 Wound Type: Other (Comment) , cellulitis  Location: Leg Location Orientation: Right Wound Description (Comments): anterior shin Present on Admission: Yes   Dressing Type Compression  wrap  medihoney, 4x4, and proforelite   Dressing Changed Changed   Dressing Status Old drainage   Dressing Change Frequency Other (Comment)   Site / Wound Assessment Black;Friable;Granulation tissue;Painful   % Wound base Red or Granulating 65%   % Wound base Other (Comment) 35%  dried blood   Peri-wound Assessment Bleeding;Edema;Erythema (blanchable)   Drainage Amount Minimal   Drainage Description Sanguineous   Treatment Cleansed;Debridement (Selective)   Wound Therapy - Clinical Statement Rt LE continues to be red, no reports of pain today and decreased swelling though edema is present.  Selective debridement for removal of slough and dried blood to promote healing.  Continued with medihoney, gauze and profore lite for edema.    Wound Therapy - Functional Problem List painful to ambulate   Factors Delaying/Impairing Wound Healing Altered sensation;Diabetes Mellitus;Infection - systemic/local;Vascular compromise   Hydrotherapy Plan Debridement;Dressing change;Patient/family education   Wound Therapy - Frequency --  2x week   Wound Therapy - Current Recommendations PT   Wound Plan Continue with current PT POC for Rt LE   Dressing  honeygel to wounds,vaseline to intact skin; proforelite for compression.         Problem List Patient Active Problem List   Diagnosis Date Noted  . Cough 12/08/2014  . Essential hypertension   . Venous stasis   . Cellulitis 11/12/2014  .  Hypertensive urgency 11/12/2014  . Fracture, finger, distal phalanx, open 11/17/2013   Becky Saxasey Cockerham, LPTA; CBIS [phone]  Juel BurrowCockerham, Casey Jo 04/01/2015, 1:23 PM  [facility] [address] [place], [place], [address] Phone: [phone]   Fax:  [phone]

## 2015-04-04 ENCOUNTER — Ambulatory Visit (HOSPITAL_COMMUNITY): Payer: PRIVATE HEALTH INSURANCE | Admitting: Physical Therapy

## 2015-04-05 ENCOUNTER — Ambulatory Visit (HOSPITAL_COMMUNITY): Payer: Commercial Managed Care - HMO | Admitting: Physical Therapy

## 2015-04-05 DIAGNOSIS — L03115 Cellulitis of right lower limb: Secondary | ICD-10-CM | POA: Diagnosis not present

## 2015-04-05 DIAGNOSIS — T148 Other injury of unspecified body region: Secondary | ICD-10-CM | POA: Diagnosis not present

## 2015-04-05 DIAGNOSIS — T148XXA Other injury of unspecified body region, initial encounter: Secondary | ICD-10-CM

## 2015-04-05 DIAGNOSIS — T148XXD Other injury of unspecified body region, subsequent encounter: Secondary | ICD-10-CM

## 2015-04-05 NOTE — Therapy (Signed)
[facility] [address] [place], [place], [address] Phone: [phone]   Fax:  [phone]  Wound Care Therapy  Patient Details  Name: Scott Matthews MRN: [id number] Date of Birth: 08/11/1946 Referring Provider:  Assunta Found, MD  Encounter Date: 04/05/2015      PT End of Session - 04/05/15 1228    Visit Number 3   Number of Visits 12   Date for PT Re-Evaluation 04/29/15   Authorization Type medicare    Authorization - Visit Number 2   Authorization - Number of Visits 10   PT Start Time 1159   PT Stop Time 1115   PT Time Calculation (min) 1396 min      Past Medical History  Diagnosis Date  . Medical history non-contributory     Past Surgical History  Procedure Laterality Date  . Colon surgery    . Colostomy      There were no vitals filed for this visit.  Visit Diagnosis:  Cellulitis of leg, right  Nonhealing nonsurgical wound  Wound healing, delayed         Wound Therapy - 04/05/15 1222    Subjective Dressing intact, no reports of pain, Pt states he does feel tightness in his LE.  Most likely due to swelling   Patient and Family Stated Goals wound to heall   Date of Onset 10/29/14   Prior Treatments acute, SNF and HH; IV, oral antibiotic and dressing changes.  Discharged from OP on 03/28/2015    Pain Assessment No/denies pain   Evaluation and Treatment Procedures Explained to Patient/Family Yes   Evaluation and Treatment Procedures agreed to   Wound Properties Date First Assessed: 03/30/15 Wound Type: Other (Comment) , cellulitis  Location: Leg Location Orientation: Right Wound Description (Comments): anterior shin Present on Admission: Yes   Dressing Type Compression wrap  medihoney, 2x2 and profore   Dressing Changed Changed   Dressing Status Old drainage   Dressing Change Frequency Other (Comment)   Site / Wound Assessment Black;Friable;Granulation tissue;Painful   % Wound base Red or Granulating  80%   % Wound base Yellow 20%   Wound Length (cm) 2 cm   Wound Width (cm) 2 cm   Drainage Amount Minimal   Drainage Description Serous   Treatment Cleansed;Debridement (Selective)   Wound Therapy - Clinical Statement Rt LE with significant decreased redness and only one open area on lateral aspect of LE measuring 2 cm x 2 cm    Wound Therapy - Functional Problem List no longer painful to ambulate   Factors Delaying/Impairing Wound Healing Altered sensation;Diabetes Mellitus;Infection - systemic/local;Vascular compromise   Hydrotherapy Plan Debridement;Dressing change;Patient/family education   Wound Therapy - Frequency --  2x week   Wound Therapy - Current Recommendations PT   Wound Plan Continue with current PT POC for Rt LE   Dressing  honeygel to wounds,vaseline to intact skin; profore for compression.     Spoke to pt about keeping LE elevated as much as possible.  If multilayer bandaging becomes uncomfortable pt is to begin taking layers off as needed.  PT is progressing towards goals.         Problem List Patient Active Problem List   Diagnosis Date Noted  . Cough 12/08/2014  . Essential hypertension   . Venous stasis   . Cellulitis 11/12/2014  . Hypertensive urgency 11/12/2014  . Fracture, finger, distal phalanx, open 11/17/2013   Virgina Organ, PT CLT [phone] 04/05/2015, 12:30 PM  [facility] [address] [place], [place], [address] Phone: [phone]   Fax:  [phone]

## 2015-04-06 ENCOUNTER — Ambulatory Visit (HOSPITAL_COMMUNITY): Payer: PRIVATE HEALTH INSURANCE | Admitting: Physical Therapy

## 2015-04-07 ENCOUNTER — Ambulatory Visit (HOSPITAL_COMMUNITY): Payer: Commercial Managed Care - HMO

## 2015-04-07 DIAGNOSIS — T148 Other injury of unspecified body region: Secondary | ICD-10-CM | POA: Diagnosis not present

## 2015-04-07 DIAGNOSIS — T148XXA Other injury of unspecified body region, initial encounter: Secondary | ICD-10-CM

## 2015-04-07 DIAGNOSIS — L03115 Cellulitis of right lower limb: Secondary | ICD-10-CM | POA: Diagnosis not present

## 2015-04-07 DIAGNOSIS — T148XXD Other injury of unspecified body region, subsequent encounter: Secondary | ICD-10-CM

## 2015-04-07 NOTE — Therapy (Signed)
[facility] [address] [place], [place], [address] Phone: [phone]   Fax:  [phone]  Wound Care Therapy  Patient Details  Name: Scott Matthews MRN: [id number] Date of Birth: May 15, 1946 Referring Provider:  Assunta Found, MD  Encounter Date: 04/07/2015      PT End of Session - 04/07/15 1258    Visit Number 4   Number of Visits 12   Date for PT Re-Evaluation 04/29/15   Authorization Type medicare    Authorization - Visit Number 4   Authorization - Number of Visits 10   PT Start Time 1152   PT Stop Time 1230   PT Time Calculation (min) 38 min   Activity Tolerance Patient tolerated treatment well   Behavior During Therapy [facility] for tasks assessed/performed      Past Medical History  Diagnosis Date  . Medical history non-contributory     Past Surgical History  Procedure Laterality Date  . Colon surgery    . Colostomy      There were no vitals filed for this visit.  Visit Diagnosis:  Cellulitis of leg, right  Nonhealing nonsurgical wound  Wound healing, delayed      Subjective Assessment - 04/07/15 1252    Subjective Dressing intact, no reports of pain today justt a little tender to the touch   Currently in Pain? No/denies             Wound Therapy - 04/07/15 1253    Subjective Dressing intact, no reports of pain today justt a little tender to the touch   Patient and Family Stated Goals wound to heall   Date of Onset 10/29/14   Prior Treatments acute, SNF and HH; IV, oral antibiotic and dressing changes.  Discharged from OP on 03/28/2015    Pain Assessment No/denies pain   Evaluation and Treatment Procedures Explained to Patient/Family Yes   Evaluation and Treatment Procedures agreed to   Wound Properties Date First Assessed: 03/30/15 Wound Type: Other (Comment) , cellulitis  Location: Leg Location Orientation: Right Wound Description (Comments): anterior shin Present on Admission: Yes   Dressing Type  Compression wrap;Impregnated gauze (bismuth)  Xeroform, vaseline, 4x4 and profore   Dressing Changed Changed   Dressing Status Old drainage   Dressing Change Frequency Other (Comment)   Site / Wound Assessment Granulation tissue;Pale;Black;Friable   % Wound base Red or Granulating 95%   % Wound base Yellow 5%   Drainage Amount Minimal   Drainage Description Serous   Treatment Cleansed;Debridement (Selective)   Wound Therapy - Clinical Statement Significant decrease in redness and minimal debridement necessary today.  Changed dressings to xeroform to promote healing.  Continued with profore for edema control.   Wound Therapy - Functional Problem List no longer painful to ambulate   Factors Delaying/Impairing Wound Healing Altered sensation;Diabetes Mellitus;Infection - systemic/local;Vascular compromise   Hydrotherapy Plan Debridement;Dressing change;Patient/family education   Wound Therapy - Frequency --  2x/week   Wound Therapy - Current Recommendations PT   Wound Plan Continue with current PT POC for Rt LE; anticipate early discharge   Dressing  Xeroform, vaseline, 4x4 and profore          Problem List Patient Active Problem List   Diagnosis Date Noted  . Cough 12/08/2014  . Essential hypertension   . Venous stasis   . Cellulitis 11/12/2014  . Hypertensive urgency 11/12/2014  . Fracture, finger, distal phalanx, open 11/17/2013   Becky Sax, LPTA; CBIS [phone]  Juel Burrow  04/07/2015, 1:00 PM  [facility] [address] [place], [place], [address] Phone: [phone]   Fax:  [phone]

## 2015-04-11 ENCOUNTER — Ambulatory Visit (HOSPITAL_COMMUNITY): Payer: PRIVATE HEALTH INSURANCE | Admitting: Physical Therapy

## 2015-04-12 ENCOUNTER — Ambulatory Visit (HOSPITAL_COMMUNITY): Payer: Commercial Managed Care - HMO | Admitting: Physical Therapy

## 2015-04-12 DIAGNOSIS — L03115 Cellulitis of right lower limb: Secondary | ICD-10-CM | POA: Diagnosis not present

## 2015-04-12 DIAGNOSIS — T148XXA Other injury of unspecified body region, initial encounter: Secondary | ICD-10-CM

## 2015-04-12 DIAGNOSIS — T148 Other injury of unspecified body region: Secondary | ICD-10-CM | POA: Diagnosis not present

## 2015-04-12 DIAGNOSIS — T148XXD Other injury of unspecified body region, subsequent encounter: Secondary | ICD-10-CM

## 2015-04-12 NOTE — Therapy (Signed)
[place], [place], [address] Phone: [phone]   Fax:  [phone]  Wound Care Therapy   Patient Details  Name: Scott Matthews MRN: [id number] Date of Birth: 04-Nov-1945 Referring Provider:  Sharilyn Sites, MD  Encounter Date: 04/21/15      PT End of Session - 04-21-15 1234    Visit Number 5   Number of Visits 5   Date for PT Re-Evaluation 04/29/15   Authorization Type medicare    Authorization - Visit Number 5   Authorization - Number of Visits 10   PT Start Time 4388   PT Stop Time 1225   PT Time Calculation (min) 30 min   Activity Tolerance Patient tolerated treatment well   Behavior During Therapy [facility] for tasks assessed/performed      Past Medical History  Diagnosis Date  . Medical history non-contributory     Past Surgical History  Procedure Laterality Date  . Colon surgery    . Colostomy      There were no vitals filed for this visit.  Visit Diagnosis:  Cellulitis of leg, right  Nonhealing nonsurgical wound  Wound healing, delayed                 Wound Therapy - April 21, 2015 1230    Subjective Pt comes today with dressing intact and without c/o pain.   Patient and Family Stated Goals wound to heall   Date of Onset 10/29/14   Prior Treatments acute, SNF and HH; IV, oral antibiotic and dressing changes.  Discharged from OP on 03/28/2015    Pain Assessment No/denies pain   Wound Properties Date First Assessed: 03/30/15 Wound Type: Other (Comment) , cellulitis  Location: Leg Location Orientation: Right Wound Description (Comments): anterior shin Present on Admission: Yes   Dressing Type None   Dressing Status Clean;Dry;Intact   % Wound base Red or Granulating 100%   Drainage Amount None   Treatment Other (Comment)   Wound Therapy - Clinical Statement No open wounds remain on Rt LE.  Noted small blister on Lt distal LE.  Encouraged patient to see about compression garments as  his circulation is impaired.  Pt given written order for MD and also given option of lighter OTC stockings if compression is too much to don/doff.  PT verbalized understanding.    Wound Plan Discharge as no longer with open wounds or in need of skilled care.                            G-Codes - 21-Apr-2015 28-Nov-1210    Functional Assessment Tool Used redness # of wounds   Other PT Primary Goal Status (I7579) At least 1 percent but less than 20 percent impaired, limited or restricted   Other PT Primary Discharge Status [phone]) 0 percent impaired, limited or restricted       Problem List Patient Active Problem List   Diagnosis Date Noted  . Cough 12/08/2014  . Essential hypertension   . Venous stasis   . Cellulitis 11/12/2014  . Hypertensive urgency 11/12/2014  . Fracture, finger, distal phalanx, open 11/17/2013    Teena Irani, PTA/CLT [place], PT CLT [phone] 04/13/2015, 12:12 PM  [facility] [place], [place], [address] Phone: [phone]   Fax:  [phone]       PHYSICAL THERAPY DISCHARGE SUMMARY  Visits from Start of Care:5  Current functional level related to goals / functional outcomes: All wounds are closed; no pain   Remaining deficits: none   Education / Equipment: Pt advised to acquire compression for LE   Plan: Patient agrees to discharge.  Patient goals were met. Patient is being discharged due to meeting the stated rehab goals.  ?????     Rayetta Humphrey, [place] CLT [phone]

## 2015-04-13 ENCOUNTER — Ambulatory Visit (HOSPITAL_COMMUNITY): Payer: PRIVATE HEALTH INSURANCE | Admitting: Physical Therapy

## 2015-04-14 ENCOUNTER — Ambulatory Visit (HOSPITAL_COMMUNITY): Payer: Commercial Managed Care - HMO

## 2015-04-19 ENCOUNTER — Ambulatory Visit (HOSPITAL_COMMUNITY): Payer: PRIVATE HEALTH INSURANCE

## 2015-04-21 ENCOUNTER — Ambulatory Visit (HOSPITAL_COMMUNITY): Payer: PRIVATE HEALTH INSURANCE

## 2015-04-26 ENCOUNTER — Ambulatory Visit (HOSPITAL_COMMUNITY): Payer: PRIVATE HEALTH INSURANCE | Admitting: Physical Therapy

## 2015-04-28 ENCOUNTER — Ambulatory Visit (HOSPITAL_COMMUNITY): Payer: PRIVATE HEALTH INSURANCE | Admitting: Physical Therapy

## 2015-05-03 ENCOUNTER — Ambulatory Visit (HOSPITAL_COMMUNITY): Payer: PRIVATE HEALTH INSURANCE

## 2015-05-05 ENCOUNTER — Ambulatory Visit (HOSPITAL_COMMUNITY): Payer: PRIVATE HEALTH INSURANCE | Admitting: Physical Therapy

## 2015-05-24 DIAGNOSIS — R609 Edema, unspecified: Secondary | ICD-10-CM | POA: Diagnosis not present

## 2015-12-09 DIAGNOSIS — Z1389 Encounter for screening for other disorder: Secondary | ICD-10-CM | POA: Diagnosis not present

## 2015-12-09 DIAGNOSIS — Z6826 Body mass index (BMI) 26.0-26.9, adult: Secondary | ICD-10-CM | POA: Diagnosis not present

## 2015-12-09 DIAGNOSIS — E663 Overweight: Secondary | ICD-10-CM | POA: Diagnosis not present

## 2015-12-09 DIAGNOSIS — Z0000 Encounter for general adult medical examination without abnormal findings: Secondary | ICD-10-CM | POA: Diagnosis not present

## 2017-12-17 DIAGNOSIS — Z6826 Body mass index (BMI) 26.0-26.9, adult: Secondary | ICD-10-CM | POA: Diagnosis not present

## 2017-12-17 DIAGNOSIS — I10 Essential (primary) hypertension: Secondary | ICD-10-CM | POA: Diagnosis not present

## 2017-12-17 DIAGNOSIS — Z0000 Encounter for general adult medical examination without abnormal findings: Secondary | ICD-10-CM | POA: Diagnosis not present

## 2017-12-17 DIAGNOSIS — Z1389 Encounter for screening for other disorder: Secondary | ICD-10-CM | POA: Diagnosis not present

## 2017-12-17 DIAGNOSIS — E663 Overweight: Secondary | ICD-10-CM | POA: Diagnosis not present

## 2017-12-26 DIAGNOSIS — R739 Hyperglycemia, unspecified: Secondary | ICD-10-CM | POA: Diagnosis not present

## 2017-12-26 DIAGNOSIS — Z0000 Encounter for general adult medical examination without abnormal findings: Secondary | ICD-10-CM | POA: Diagnosis not present

## 2017-12-26 DIAGNOSIS — R7309 Other abnormal glucose: Secondary | ICD-10-CM | POA: Diagnosis not present

## 2019-02-19 DIAGNOSIS — Z1389 Encounter for screening for other disorder: Secondary | ICD-10-CM | POA: Diagnosis not present

## 2019-02-19 DIAGNOSIS — Z125 Encounter for screening for malignant neoplasm of prostate: Secondary | ICD-10-CM | POA: Diagnosis not present

## 2019-02-19 DIAGNOSIS — E663 Overweight: Secondary | ICD-10-CM | POA: Diagnosis not present

## 2019-02-19 DIAGNOSIS — Z6826 Body mass index (BMI) 26.0-26.9, adult: Secondary | ICD-10-CM | POA: Diagnosis not present

## 2019-02-19 DIAGNOSIS — I10 Essential (primary) hypertension: Secondary | ICD-10-CM | POA: Diagnosis not present

## 2019-02-19 DIAGNOSIS — R7309 Other abnormal glucose: Secondary | ICD-10-CM | POA: Diagnosis not present

## 2019-02-19 DIAGNOSIS — Z0001 Encounter for general adult medical examination with abnormal findings: Secondary | ICD-10-CM | POA: Diagnosis not present

## 2019-02-19 DIAGNOSIS — E7849 Other hyperlipidemia: Secondary | ICD-10-CM | POA: Diagnosis not present

## 2019-08-22 DIAGNOSIS — Z1211 Encounter for screening for malignant neoplasm of colon: Secondary | ICD-10-CM | POA: Diagnosis not present

## 2020-02-19 DIAGNOSIS — I10 Essential (primary) hypertension: Secondary | ICD-10-CM | POA: Diagnosis not present

## 2020-02-19 DIAGNOSIS — Z933 Colostomy status: Secondary | ICD-10-CM | POA: Diagnosis not present

## 2020-02-19 DIAGNOSIS — R7309 Other abnormal glucose: Secondary | ICD-10-CM | POA: Diagnosis not present

## 2020-02-19 DIAGNOSIS — Z1389 Encounter for screening for other disorder: Secondary | ICD-10-CM | POA: Diagnosis not present

## 2020-02-19 DIAGNOSIS — Z6824 Body mass index (BMI) 24.0-24.9, adult: Secondary | ICD-10-CM | POA: Diagnosis not present

## 2020-02-19 DIAGNOSIS — E119 Type 2 diabetes mellitus without complications: Secondary | ICD-10-CM | POA: Diagnosis not present

## 2020-02-19 DIAGNOSIS — E7849 Other hyperlipidemia: Secondary | ICD-10-CM | POA: Diagnosis not present

## 2020-02-19 DIAGNOSIS — Z0001 Encounter for general adult medical examination with abnormal findings: Secondary | ICD-10-CM | POA: Diagnosis not present

## 2020-02-19 DIAGNOSIS — M1991 Primary osteoarthritis, unspecified site: Secondary | ICD-10-CM | POA: Diagnosis not present

## 2020-03-15 DIAGNOSIS — H524 Presbyopia: Secondary | ICD-10-CM | POA: Diagnosis not present

## 2020-08-24 DIAGNOSIS — Z0100 Encounter for examination of eyes and vision without abnormal findings: Secondary | ICD-10-CM | POA: Diagnosis not present

## 2021-05-09 DIAGNOSIS — H524 Presbyopia: Secondary | ICD-10-CM | POA: Diagnosis not present

## 2021-06-20 DIAGNOSIS — H2513 Age-related nuclear cataract, bilateral: Secondary | ICD-10-CM | POA: Diagnosis not present

## 2021-06-20 DIAGNOSIS — H40003 Preglaucoma, unspecified, bilateral: Secondary | ICD-10-CM | POA: Diagnosis not present

## 2021-09-14 ENCOUNTER — Emergency Department (HOSPITAL_COMMUNITY): Payer: Medicare HMO

## 2021-09-14 ENCOUNTER — Observation Stay (HOSPITAL_COMMUNITY)
Admission: EM | Admit: 2021-09-14 | Discharge: 2021-09-19 | Disposition: A | Discharge status: Home / Self Care | Payer: Medicare HMO | Attending: Emergency Medicine | Admitting: Emergency Medicine

## 2021-09-14 ENCOUNTER — Encounter (HOSPITAL_COMMUNITY): Payer: Self-pay | Admitting: *Deleted

## 2021-09-14 ENCOUNTER — Other Ambulatory Visit: Payer: Self-pay

## 2021-09-14 DIAGNOSIS — G319 Degenerative disease of nervous system, unspecified: Secondary | ICD-10-CM | POA: Diagnosis not present

## 2021-09-14 DIAGNOSIS — R2681 Unsteadiness on feet: Secondary | ICD-10-CM | POA: Diagnosis not present

## 2021-09-14 DIAGNOSIS — E876 Hypokalemia: Secondary | ICD-10-CM | POA: Insufficient documentation

## 2021-09-14 DIAGNOSIS — I951 Orthostatic hypotension: Secondary | ICD-10-CM | POA: Diagnosis present

## 2021-09-14 DIAGNOSIS — J321 Chronic frontal sinusitis: Secondary | ICD-10-CM | POA: Diagnosis not present

## 2021-09-14 DIAGNOSIS — Z79899 Other long term (current) drug therapy: Secondary | ICD-10-CM | POA: Insufficient documentation

## 2021-09-14 DIAGNOSIS — I10 Essential (primary) hypertension: Secondary | ICD-10-CM | POA: Insufficient documentation

## 2021-09-14 DIAGNOSIS — J320 Chronic maxillary sinusitis: Secondary | ICD-10-CM | POA: Diagnosis not present

## 2021-09-14 DIAGNOSIS — R400 Somnolence: Secondary | ICD-10-CM | POA: Diagnosis not present

## 2021-09-14 DIAGNOSIS — Z933 Colostomy status: Secondary | ICD-10-CM | POA: Diagnosis not present

## 2021-09-14 DIAGNOSIS — S299XXA Unspecified injury of thorax, initial encounter: Secondary | ICD-10-CM | POA: Insufficient documentation

## 2021-09-14 DIAGNOSIS — R0902 Hypoxemia: Secondary | ICD-10-CM | POA: Diagnosis not present

## 2021-09-14 DIAGNOSIS — Z7982 Long term (current) use of aspirin: Secondary | ICD-10-CM | POA: Insufficient documentation

## 2021-09-14 DIAGNOSIS — S0990XA Unspecified injury of head, initial encounter: Secondary | ICD-10-CM | POA: Insufficient documentation

## 2021-09-14 DIAGNOSIS — S01512A Laceration without foreign body of oral cavity, initial encounter: Secondary | ICD-10-CM | POA: Insufficient documentation

## 2021-09-14 DIAGNOSIS — Z20822 Contact with and (suspected) exposure to covid-19: Secondary | ICD-10-CM | POA: Diagnosis not present

## 2021-09-14 DIAGNOSIS — R410 Disorientation, unspecified: Secondary | ICD-10-CM | POA: Diagnosis not present

## 2021-09-14 DIAGNOSIS — I6782 Cerebral ischemia: Secondary | ICD-10-CM | POA: Diagnosis not present

## 2021-09-14 DIAGNOSIS — S0993XA Unspecified injury of face, initial encounter: Secondary | ICD-10-CM | POA: Diagnosis not present

## 2021-09-14 DIAGNOSIS — J3489 Other specified disorders of nose and nasal sinuses: Secondary | ICD-10-CM | POA: Diagnosis not present

## 2021-09-14 DIAGNOSIS — J9811 Atelectasis: Secondary | ICD-10-CM | POA: Diagnosis not present

## 2021-09-14 DIAGNOSIS — S3991XA Unspecified injury of abdomen, initial encounter: Secondary | ICD-10-CM | POA: Diagnosis not present

## 2021-09-14 DIAGNOSIS — S01511A Laceration without foreign body of lip, initial encounter: Secondary | ICD-10-CM | POA: Diagnosis not present

## 2021-09-14 DIAGNOSIS — M47812 Spondylosis without myelopathy or radiculopathy, cervical region: Secondary | ICD-10-CM | POA: Diagnosis not present

## 2021-09-14 DIAGNOSIS — R10817 Generalized abdominal tenderness: Secondary | ICD-10-CM | POA: Diagnosis not present

## 2021-09-14 HISTORY — DX: Essential (primary) hypertension: I10

## 2021-09-14 LAB — CBC WITH DIFFERENTIAL/PLATELET
Abs Immature Granulocytes: 0.11 10*3/uL — ABNORMAL HIGH (ref 0.00–0.07)
Basophils Absolute: 0 10*3/uL (ref 0.0–0.1)
Basophils Relative: 0 %
Eosinophils Absolute: 0 10*3/uL (ref 0.0–0.5)
Eosinophils Relative: 0 %
HCT: 42.5 % (ref 39.0–52.0)
Hemoglobin: 13.5 g/dL (ref 13.0–17.0)
Immature Granulocytes: 2 %
Lymphocytes Relative: 10 %
Lymphs Abs: 0.7 10*3/uL (ref 0.7–4.0)
MCH: 25 pg — ABNORMAL LOW (ref 26.0–34.0)
MCHC: 31.8 g/dL (ref 30.0–36.0)
MCV: 78.6 fL — ABNORMAL LOW (ref 80.0–100.0)
Monocytes Absolute: 0.4 10*3/uL (ref 0.1–1.0)
Monocytes Relative: 6 %
Neutro Abs: 6.2 10*3/uL (ref 1.7–7.7)
Neutrophils Relative %: 82 %
Platelets: 160 10*3/uL (ref 150–400)
RBC: 5.41 MIL/uL (ref 4.22–5.81)
RDW: 15.8 % — ABNORMAL HIGH (ref 11.5–15.5)
WBC: 7.5 10*3/uL (ref 4.0–10.5)
nRBC: 0 % (ref 0.0–0.2)

## 2021-09-14 LAB — TYPE AND SCREEN
ABO/RH(D): O POS
Antibody Screen: NEGATIVE

## 2021-09-14 LAB — COMPREHENSIVE METABOLIC PANEL
ALT: 54 U/L — ABNORMAL HIGH (ref 0–44)
AST: 67 U/L — ABNORMAL HIGH (ref 15–41)
Albumin: 4 g/dL (ref 3.5–5.0)
Alkaline Phosphatase: 60 U/L (ref 38–126)
Anion gap: 5 (ref 5–15)
BUN: 21 mg/dL (ref 8–23)
CO2: 28 mmol/L (ref 22–32)
Calcium: 8.6 mg/dL — ABNORMAL LOW (ref 8.9–10.3)
Chloride: 105 mmol/L (ref 98–111)
Creatinine, Ser: 1.14 mg/dL (ref 0.61–1.24)
GFR, Estimated: >60 mL/min (ref >60)
Glucose, Bld: 108 mg/dL — ABNORMAL HIGH (ref 70–99)
Potassium: 3.1 mmol/L — ABNORMAL LOW (ref 3.5–5.1)
Sodium: 138 mmol/L (ref 135–145)
Total Bilirubin: 0.5 mg/dL (ref 0.3–1.2)
Total Protein: 7.5 g/dL (ref 6.5–8.1)

## 2021-09-14 LAB — CBG MONITORING, ED: Glucose-Capillary: 123 mg/dL — ABNORMAL HIGH (ref 70–99)

## 2021-09-14 MED ORDER — SODIUM CHLORIDE 0.9 % IV SOLN
INTRAVENOUS | Status: DC
Start: 2021-09-14 — End: 2021-09-15

## 2021-09-14 MED ORDER — METOPROLOL TARTRATE 5 MG/5ML IV SOLN
5.0000 mg | Freq: Once | INTRAVENOUS | Status: AC
  Administered 2021-09-14: 21:00:00 5 mg via INTRAVENOUS
  Filled 2021-09-14: qty 5

## 2021-09-14 MED ORDER — ONDANSETRON HCL 4 MG/2ML IJ SOLN
4.0000 mg | INTRAMUSCULAR | Status: AC
  Administered 2021-09-14: 21:00:00 4 mg via INTRAVENOUS
  Filled 2021-09-14: qty 2

## 2021-09-14 MED ORDER — METOPROLOL TARTRATE 50 MG PO TABS: 25.0000 mg | ORAL_TABLET | Freq: Two times a day (BID) | ORAL | 1 refills | Status: DC

## 2021-09-14 MED ORDER — NAPROXEN 500 MG PO TABS: 500.0000 mg | ORAL_TABLET | Freq: Two times a day (BID) | ORAL | 0 refills | Status: DC

## 2021-09-14 MED ORDER — IOHEXOL 300 MG/ML  SOLN
100.0000 mL | Freq: Once | INTRAMUSCULAR | Status: AC | PRN
  Administered 2021-09-14: 22:00:00 100 mL via INTRAVENOUS

## 2021-09-14 MED ORDER — MORPHINE SULFATE (PF) 4 MG/ML IV SOLN
4.0000 mg | Freq: Once | INTRAVENOUS | Status: AC
  Administered 2021-09-14: 21:00:00 4 mg via INTRAVENOUS
  Filled 2021-09-14: qty 1

## 2021-09-14 NOTE — ED Notes (Signed)
Spoke with Ramon Dredge, listed as patient contact, informed Ramon Dredge that pt would be discharged and needs a way home, Ramon Dredge says he will come and get pt, will be here in about an hour.

## 2021-09-14 NOTE — ED Notes (Signed)
Pt BP is high, reading 201/129 at this time- pt says he has not taken his BP meds in "at least three months". Dr Hyacinth Meeker made aware.
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
## 2021-09-14 NOTE — ED Notes (Addendum)
Pt assisted with removing pants per EDP request, pt had urinated in bed- bed linen changed- pt given urinal and instructions on how to use, pt demonstrated ability to use and understanding of how to use with this nurse. Ensured that pt also demonstrated understanding of how to use call bell. Call bell placed around side rail within pt reach.
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
## 2021-09-14 NOTE — Discharge Instructions (Addendum)
Follow with Primary MD Elfredia Nevins, MD in 7 days   Get CBC, CMP, checked  by Primary MD next visit.    Activity: As tolerated with Full fall precautions use walker/cane & assistance as needed   Disposition Home    Diet: Heart Healthy    On your next visit with your primary care physician please Get Medicines reviewed and adjusted.   Please request your Prim.MD to go over all Hospital Tests and Procedure/Radiological results at the follow up, please get all Hospital records sent to your Prim MD by signing hospital release before you go home.   If you experience worsening of your admission symptoms, develop shortness of breath, life threatening emergency, suicidal or homicidal thoughts you must seek medical attention immediately by calling 911 or calling your MD immediately  if symptoms less severe.  You Must read complete instructions/literature along with all the possible adverse reactions/side effects for all the Medicines you take and that have been prescribed to you. Take any new Medicines after you have completely understood and accpet all the possible adverse reactions/side effects.   Do not drive, operating heavy machinery, perform activities at heights, swimming or participation in water activities or provide baby sitting services if your were admitted for syncope or siezures until you have seen by Primary MD or a Neurologist and advised to do so again.  Do not drive when taking Pain medications.    Do not take more than prescribed Pain, Sleep and Anxiety Medications  Special Instructions: If you have smoked or chewed Tobacco  in the last 2 yrs please stop smoking, stop any regular Alcohol  and or any Recreational drug use.  Wear Seat belts while driving.   Please note  You were cared for by a hospitalist during your hospital stay. If you have any questions about your discharge medications or the care you received while you were in the hospital after you are  discharged, you can call the unit and asked to speak with the hospitalist on call if the hospitalist that took care of you is not available. Once you are discharged, your primary care physician will handle any further medical issues. Please note that NO REFILLS for any discharge medications will be authorized once you are discharged, as it is imperative that you return to your primary care physician (or establish a relationship with a primary care physician if you do not have one) for your aftercare needs so that they can reassess your need for medications and monitor your lab values.

## 2021-09-15 ENCOUNTER — Emergency Department (HOSPITAL_COMMUNITY): Payer: Medicare HMO

## 2021-09-15 ENCOUNTER — Observation Stay (HOSPITAL_BASED_OUTPATIENT_CLINIC_OR_DEPARTMENT_OTHER): Payer: Medicare HMO

## 2021-09-15 DIAGNOSIS — I10 Essential (primary) hypertension: Secondary | ICD-10-CM | POA: Diagnosis not present

## 2021-09-15 DIAGNOSIS — S299XXA Unspecified injury of thorax, initial encounter: Secondary | ICD-10-CM | POA: Diagnosis not present

## 2021-09-15 DIAGNOSIS — S0990XA Unspecified injury of head, initial encounter: Secondary | ICD-10-CM | POA: Diagnosis not present

## 2021-09-15 DIAGNOSIS — I6782 Cerebral ischemia: Secondary | ICD-10-CM | POA: Diagnosis not present

## 2021-09-15 DIAGNOSIS — R410 Disorientation, unspecified: Secondary | ICD-10-CM | POA: Diagnosis not present

## 2021-09-15 DIAGNOSIS — Z7982 Long term (current) use of aspirin: Secondary | ICD-10-CM | POA: Diagnosis not present

## 2021-09-15 DIAGNOSIS — R079 Chest pain, unspecified: Secondary | ICD-10-CM

## 2021-09-15 DIAGNOSIS — E876 Hypokalemia: Secondary | ICD-10-CM | POA: Diagnosis not present

## 2021-09-15 DIAGNOSIS — I951 Orthostatic hypotension: Secondary | ICD-10-CM

## 2021-09-15 DIAGNOSIS — J320 Chronic maxillary sinusitis: Secondary | ICD-10-CM | POA: Diagnosis not present

## 2021-09-15 DIAGNOSIS — Z20822 Contact with and (suspected) exposure to covid-19: Secondary | ICD-10-CM | POA: Diagnosis not present

## 2021-09-15 DIAGNOSIS — S01511A Laceration without foreign body of lip, initial encounter: Secondary | ICD-10-CM | POA: Diagnosis not present

## 2021-09-15 DIAGNOSIS — Z933 Colostomy status: Secondary | ICD-10-CM | POA: Diagnosis not present

## 2021-09-15 DIAGNOSIS — Z79899 Other long term (current) drug therapy: Secondary | ICD-10-CM | POA: Diagnosis not present

## 2021-09-15 DIAGNOSIS — G319 Degenerative disease of nervous system, unspecified: Secondary | ICD-10-CM | POA: Diagnosis not present

## 2021-09-15 DIAGNOSIS — S01512A Laceration without foreign body of oral cavity, initial encounter: Secondary | ICD-10-CM | POA: Diagnosis not present

## 2021-09-15 LAB — ECHOCARDIOGRAM COMPLETE
Area-P 1/2: 2.69 cm2
Height: 67 in
S' Lateral: 2.3 cm
Weight: 2640 oz

## 2021-09-15 LAB — HEMOGLOBIN AND HEMATOCRIT, BLOOD
HCT: 38.3 % — ABNORMAL LOW (ref 39.0–52.0)
HCT: 42.1 % (ref 39.0–52.0)
Hemoglobin: 11.9 g/dL — ABNORMAL LOW (ref 13.0–17.0)
Hemoglobin: 13 g/dL (ref 13.0–17.0)

## 2021-09-15 LAB — RESP PANEL BY RT-PCR (FLU A&B, COVID) ARPGX2
Influenza A by PCR: NEGATIVE
Influenza B by PCR: NEGATIVE
SARS Coronavirus 2 by RT PCR: NEGATIVE

## 2021-09-15 LAB — CBG MONITORING, ED: Glucose-Capillary: 146 mg/dL — ABNORMAL HIGH (ref 70–99)

## 2021-09-15 MED ORDER — SODIUM CHLORIDE 0.9 % IV SOLN: INTRAVENOUS | Status: AC

## 2021-09-15 MED ORDER — SODIUM CHLORIDE 0.9 % IV SOLN: INTRAVENOUS | Status: DC

## 2021-09-15 MED ORDER — ACETAMINOPHEN 650 MG RE SUPP: 650.0000 mg | Freq: Four times a day (QID) | RECTAL | Status: DC | PRN

## 2021-09-15 MED ORDER — ONDANSETRON HCL 4 MG/2ML IJ SOLN: 4.0000 mg | Freq: Four times a day (QID) | INTRAMUSCULAR | Status: DC | PRN

## 2021-09-15 MED ORDER — ASPIRIN EC 81 MG PO TBEC
81.0000 mg | DELAYED_RELEASE_TABLET | Freq: Every day | ORAL | Status: DC
  Administered 2021-09-15 – 2021-09-19 (×5): 81 mg via ORAL
  Filled 2021-09-15 (×5): qty 1

## 2021-09-15 MED ORDER — HYDROCODONE-ACETAMINOPHEN 10-325 MG PO TABS
1.0000 | ORAL_TABLET | Freq: Four times a day (QID) | ORAL | Status: DC | PRN
  Administered 2021-09-16 – 2021-09-17 (×2): 1 via ORAL
  Filled 2021-09-15 (×2): qty 1

## 2021-09-15 MED ORDER — ACETAMINOPHEN 325 MG PO TABS: 650.0000 mg | ORAL_TABLET | Freq: Four times a day (QID) | ORAL | Status: DC | PRN

## 2021-09-15 MED ORDER — POTASSIUM CHLORIDE CRYS ER 20 MEQ PO TBCR
40.0000 meq | EXTENDED_RELEASE_TABLET | Freq: Once | ORAL | Status: AC
  Administered 2021-09-15: 12:00:00 40 meq via ORAL
  Filled 2021-09-15: qty 2

## 2021-09-15 MED ORDER — ONDANSETRON HCL 4 MG PO TABS: 4.0000 mg | ORAL_TABLET | Freq: Four times a day (QID) | ORAL | Status: DC | PRN

## 2021-09-15 MED ORDER — SODIUM CHLORIDE 0.9 % IV BOLUS
1000.0000 mL | Freq: Once | INTRAVENOUS | Status: AC
  Administered 2021-09-15: 07:00:00 1000 mL via INTRAVENOUS
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
## 2021-09-15 NOTE — ED Notes (Signed)
Pt became very drowsy, color is pale, pt slow to respond to questions. Pt friend, Scott Matthews who has been his life long friend says pt is not acting like himself, he says pt is usually active and cutting up. Pt taken back inside to room 10- Dr Pilar Plate made aware of situation. Pt placed on monitor and EKG done, given to Dr Pilar Plate

## 2021-09-15 NOTE — ED Notes (Addendum)
Pt HR while laying in bed ranged from 55-60, upon standing pt HR increased to range between 83-100, then decreased to baseline when assisted to lying position. Dr Pilar Plate made aware.

## 2021-09-15 NOTE — ED Notes (Signed)
Pt color has improved, not as pale, pt is quicker to respond when asked questions, pt seems more alert at this time.

## 2021-09-15 NOTE — ED Notes (Signed)
Placed colostomy bag on pt- pt came in with ABD pad covering stoma.

## 2021-09-15 NOTE — Progress Notes (Signed)
*  PRELIMINARY RESULTS* Echocardiogram 2D Echocardiogram has been performed.  Stacey Drain 09/15/2021, 12:34 PM

## 2021-09-15 NOTE — ED Notes (Signed)
Per orders from MD, pt was assisted to stand to assess his ability to walk. Pt was assisted to stand and was unable to keep his back and neck straight or hold his head up. Pt complains of pain in the right rib/chest area and states the pain feels the same as when he came into the hospital. Pt was seated back on the bed. MD made aware of same.
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
## 2021-09-16 DIAGNOSIS — S01511A Laceration without foreign body of lip, initial encounter: Secondary | ICD-10-CM | POA: Diagnosis not present

## 2021-09-16 DIAGNOSIS — I951 Orthostatic hypotension: Secondary | ICD-10-CM | POA: Diagnosis not present

## 2021-09-16 DIAGNOSIS — I10 Essential (primary) hypertension: Secondary | ICD-10-CM

## 2021-09-16 DIAGNOSIS — R4189 Other symptoms and signs involving cognitive functions and awareness: Secondary | ICD-10-CM | POA: Diagnosis not present

## 2021-09-16 LAB — COMPREHENSIVE METABOLIC PANEL
ALT: 38 U/L (ref 0–44)
AST: 45 U/L — ABNORMAL HIGH (ref 15–41)
Albumin: 2.7 g/dL — ABNORMAL LOW (ref 3.5–5.0)
Alkaline Phosphatase: 45 U/L (ref 38–126)
Anion gap: 6 (ref 5–15)
BUN: 14 mg/dL (ref 8–23)
CO2: 24 mmol/L (ref 22–32)
Calcium: 7.9 mg/dL — ABNORMAL LOW (ref 8.9–10.3)
Chloride: 109 mmol/L (ref 98–111)
Creatinine, Ser: 1.27 mg/dL — ABNORMAL HIGH (ref 0.61–1.24)
GFR, Estimated: 58 mL/min — ABNORMAL LOW (ref >60)
Glucose, Bld: 96 mg/dL (ref 70–99)
Potassium: 3.6 mmol/L (ref 3.5–5.1)
Sodium: 139 mmol/L (ref 135–145)
Total Bilirubin: 0.7 mg/dL (ref 0.3–1.2)
Total Protein: 5.7 g/dL — ABNORMAL LOW (ref 6.5–8.1)

## 2021-09-16 LAB — MAGNESIUM: Magnesium: 2.2 mg/dL (ref 1.7–2.4)

## 2021-09-16 LAB — CBC
HCT: 35.8 % — ABNORMAL LOW (ref 39.0–52.0)
Hemoglobin: 11.5 g/dL — ABNORMAL LOW (ref 13.0–17.0)
MCH: 24.8 pg — ABNORMAL LOW (ref 26.0–34.0)
MCHC: 32.1 g/dL (ref 30.0–36.0)
MCV: 77.3 fL — ABNORMAL LOW (ref 80.0–100.0)
Platelets: 122 10*3/uL — ABNORMAL LOW (ref 150–400)
RBC: 4.63 MIL/uL (ref 4.22–5.81)
RDW: 15.6 % — ABNORMAL HIGH (ref 11.5–15.5)
WBC: 6.4 10*3/uL (ref 4.0–10.5)
nRBC: 0 % (ref 0.0–0.2)

## 2021-09-16 MED ORDER — HYDROCHLOROTHIAZIDE 12.5 MG PO TABS
12.5000 mg | ORAL_TABLET | Freq: Every day | ORAL | Status: DC
  Administered 2021-09-16 – 2021-09-19 (×4): 12.5 mg via ORAL
  Filled 2021-09-16 (×4): qty 1

## 2021-09-16 MED ORDER — HEPARIN SODIUM (PORCINE) 5000 UNIT/ML IJ SOLN
5000.0000 [IU] | Freq: Three times a day (TID) | INTRAMUSCULAR | Status: DC
  Administered 2021-09-17 – 2021-09-19 (×7): 5000 [IU] via SUBCUTANEOUS
  Filled 2021-09-16 (×7): qty 1

## 2021-09-16 MED ORDER — HYDRALAZINE HCL 20 MG/ML IJ SOLN: 10.0000 mg | INTRAMUSCULAR | Status: DC | PRN

## 2021-09-16 MED ORDER — BENAZEPRIL-HYDROCHLOROTHIAZIDE 10-12.5 MG PO TABS: 1.0000 | ORAL_TABLET | Freq: Every day | ORAL | Status: DC

## 2021-09-16 MED ORDER — SODIUM CHLORIDE 0.9 % IV SOLN: INTRAVENOUS | Status: DC

## 2021-09-16 MED ORDER — BENAZEPRIL HCL 5 MG PO TABS
10.0000 mg | ORAL_TABLET | Freq: Every day | ORAL | Status: DC
  Administered 2021-09-16 – 2021-09-19 (×4): 10 mg via ORAL
  Filled 2021-09-16 (×4): qty 2
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
## 2021-09-16 NOTE — Consult Note (Signed)
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 discharge. From a surgical standpoint, he is currently doing well and does not require any further trauma work-up. We will see him as needed.  Please call us back for changes.  Coralie Keens MD 09/16/2021, 9:34 AM    I spent a total of 45 minutes in both face-to-face and non-face-to-face activities, excluding procedures performed, for this visit on the date of this encounter.

## 2021-09-16 NOTE — Progress Notes (Signed)
TRH night cross cover note:  I was notified by RN of patient's elevated blood pressure this morning of 177/98 mmHg.   Per my chart review, including review of H&P, patient's hospital course has included mild orthostasis as well as bradycardia, with heart rates in the 50s.  In this context, will refrain from aggressive blood pressure management.  Rather, I have placed order for as needed IV hydralazine for systolic blood pressure greater than 180 or diastolic blood pressure greater than 120.  Refraining from AV nodal blocking agents given concomitant mild bradycardia.     Newton Pigg, DO Hospitalist
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
## 2021-09-16 NOTE — Care Management Obs Status (Signed)
MEDICARE OBSERVATION STATUS NOTIFICATION   Patient Details  Name: Scott Matthews MRN: [id number] Date of Birth: 01-Apr-1946   Medicare Observation Status Notification Given:  Yes    Lawerance Sabal, RN 09/16/2021, 11:00 AM

## 2021-09-16 NOTE — Plan of Care (Signed)
The appropriate patient status for this patient is OBSERVATION. Observation status is judged to be reasonable and necessary in order to provide the required intensity of service to ensure the patient's safety. The patient's presenting symptoms, physical exam findings, and initial radiographic and laboratory data in the context of their medical condition is felt to place them at decreased risk for further clinical deterioration. Furthermore, it is anticipated that the patient will be medically stable for discharge from the hospital within 2 midnights of admission.
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
## 2021-09-17 DIAGNOSIS — S01511A Laceration without foreign body of lip, initial encounter: Secondary | ICD-10-CM | POA: Diagnosis not present

## 2021-09-17 DIAGNOSIS — I10 Essential (primary) hypertension: Secondary | ICD-10-CM | POA: Diagnosis not present

## 2021-09-17 DIAGNOSIS — I951 Orthostatic hypotension: Secondary | ICD-10-CM | POA: Diagnosis not present

## 2021-09-17 LAB — BASIC METABOLIC PANEL
Anion gap: 6 (ref 5–15)
BUN: 21 mg/dL (ref 8–23)
CO2: 25 mmol/L (ref 22–32)
Calcium: 8.1 mg/dL — ABNORMAL LOW (ref 8.9–10.3)
Chloride: 104 mmol/L (ref 98–111)
Creatinine, Ser: 1.26 mg/dL — ABNORMAL HIGH (ref 0.61–1.24)
GFR, Estimated: 59 mL/min — ABNORMAL LOW (ref >60)
Glucose, Bld: 119 mg/dL — ABNORMAL HIGH (ref 70–99)
Potassium: 3.4 mmol/L — ABNORMAL LOW (ref 3.5–5.1)
Sodium: 135 mmol/L (ref 135–145)

## 2021-09-17 LAB — CBC
HCT: 37 % — ABNORMAL LOW (ref 39.0–52.0)
Hemoglobin: 12.3 g/dL — ABNORMAL LOW (ref 13.0–17.0)
MCH: 25.2 pg — ABNORMAL LOW (ref 26.0–34.0)
MCHC: 33.2 g/dL (ref 30.0–36.0)
MCV: 75.7 fL — ABNORMAL LOW (ref 80.0–100.0)
Platelets: 132 10*3/uL — ABNORMAL LOW (ref 150–400)
RBC: 4.89 MIL/uL (ref 4.22–5.81)
RDW: 15.2 % (ref 11.5–15.5)
WBC: 7.9 10*3/uL (ref 4.0–10.5)
nRBC: 0 % (ref 0.0–0.2)

## 2021-09-17 MED ORDER — AMLODIPINE BESYLATE 5 MG PO TABS
5.0000 mg | ORAL_TABLET | Freq: Every day | ORAL | Status: DC
  Administered 2021-09-17 – 2021-09-19 (×3): 5 mg via ORAL
  Filled 2021-09-17 (×3): qty 1

## 2021-09-17 MED ORDER — POTASSIUM CHLORIDE CRYS ER 20 MEQ PO TBCR
40.0000 meq | EXTENDED_RELEASE_TABLET | Freq: Once | ORAL | Status: AC
  Administered 2021-09-17: 40 meq via ORAL
  Filled 2021-09-17: qty 2
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
## 2021-09-18 DIAGNOSIS — I10 Essential (primary) hypertension: Secondary | ICD-10-CM | POA: Diagnosis not present

## 2021-09-18 DIAGNOSIS — I951 Orthostatic hypotension: Secondary | ICD-10-CM | POA: Diagnosis not present

## 2021-09-18 DIAGNOSIS — S01511A Laceration without foreign body of lip, initial encounter: Secondary | ICD-10-CM | POA: Diagnosis not present
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
## 2021-09-19 ENCOUNTER — Other Ambulatory Visit (HOSPITAL_COMMUNITY): Payer: Self-pay

## 2021-09-19 DIAGNOSIS — S01511A Laceration without foreign body of lip, initial encounter: Secondary | ICD-10-CM | POA: Diagnosis not present

## 2021-09-19 DIAGNOSIS — I10 Essential (primary) hypertension: Secondary | ICD-10-CM | POA: Diagnosis not present

## 2021-09-19 DIAGNOSIS — I951 Orthostatic hypotension: Secondary | ICD-10-CM | POA: Diagnosis not present

## 2021-09-19 MED ORDER — BENAZEPRIL-HYDROCHLOROTHIAZIDE 10-12.5 MG PO TABS
1.0000 | ORAL_TABLET | Freq: Every day | ORAL | 0 refills | Status: AC
  Filled 2021-09-19: qty 30, 30d supply, fill #0

## 2021-09-19 MED ORDER — AMLODIPINE BESYLATE 5 MG PO TABS
5.0000 mg | ORAL_TABLET | Freq: Every day | ORAL | 0 refills | Status: AC
  Filled 2021-09-19: qty 30, 30d supply, fill #0

## 2021-09-19 MED ORDER — ACETAMINOPHEN 325 MG PO TABS: 650.0000 mg | ORAL_TABLET | Freq: Four times a day (QID) | ORAL | Status: DC | PRN

## 2021-09-19 NOTE — Discharge Summary (Signed)
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 NEGATIVE NEGATIVE Final   Influenza B by PCR NEGATIVE NEGATIVE Final    Comment: (NOTE) The Xpert Xpress SARS-CoV-2/FLU/RSV plus assay is intended as an aid in the diagnosis of influenza from Nasopharyngeal swab specimens and should not be used as a sole basis for treatment. Nasal washings and aspirates are unacceptable for Xpert Xpress SARS-CoV-2/FLU/RSV testing.  Fact Sheet for Patients: BloggerCourse.comhttps://www.fda.gov/media/152166/download  Fact Sheet for Healthcare Providers: SeriousBroker.ithttps://www.fda.gov/media/152162/download  This test is not yet approved or cleared by the [place] FDA and has been authorized for detection and/or diagnosis of SARS-CoV-2 by FDA under an Emergency Use Authorization (EUA). This EUA will remain in effect (meaning this test can be used) for the duration of the COVID-19 declaration under Section 564(b)(1) of the Act, 21 U.S.C. section 360bbb-3(b)(1), unless the authorization is terminated or revoked.  Performed at [facility], [address] [place], [place] [address]      Labs: BNP (last 3  results) No results for input(s): BNP in the last 8760 hours. Basic Metabolic Panel: Recent Labs  Lab 09/14/21 2040 09/16/21 0106 09/17/21 0105  NA 138 139 135  K 3.1* 3.6 3.4*  CL  105 109 104  CO2 28 24 25   GLUCOSE 108* 96 119*  BUN 21 14 21   CREATININE 1.14 1.27* 1.26*  CALCIUM 8.6* 7.9* 8.1*  MG  --  2.2  --    Liver Function Tests: Recent Labs  Lab 09/14/21 2040 09/16/21 0106  AST 67* 45*  ALT 54* 38  ALKPHOS 60 45  BILITOT 0.5 0.7  PROT 7.5 5.7*  ALBUMIN 4.0 2.7*   No results for input(s): LIPASE, AMYLASE in the last 168 hours. No results for input(s): AMMONIA in the last 168 hours. CBC: Recent Labs  Lab 09/14/21 2040 09/15/21 0741 09/15/21 1156 09/16/21 0106 09/17/21 0105  WBC 7.5  --   --  6.4 7.9  NEUTROABS 6.2  --   --   --   --   HGB 13.5 11.9* 13.0 11.5* 12.3*  HCT 42.5 38.3* 42.1 35.8* 37.0*  MCV 78.6*  --   --  77.3* 75.7*  PLT 160  --   --  122* 132*   Cardiac Enzymes: No results for input(s): CKTOTAL, CKMB, CKMBINDEX, TROPONINI in the last 168 hours. BNP: Invalid input(s): POCBNP CBG: Recent Labs  Lab 09/14/21 1940 09/15/21 0115  GLUCAP 123* 146*   D-Dimer No results for input(s): DDIMER in the last 72 hours. Hgb A1c No results for input(s): HGBA1C in the last 72 hours. Lipid Profile No results for input(s): CHOL, HDL, LDLCALC, TRIG, CHOLHDL, LDLDIRECT in the last 72 hours. Thyroid function studies No results for input(s): TSH, T4TOTAL, T3FREE, THYROIDAB in the last 72 hours.  Invalid input(s): FREET3 Anemia work up No results for input(s): VITAMINB12, FOLATE, FERRITIN, TIBC, IRON, RETICCTPCT in the last 72 hours. Urinalysis No results found for: COLORURINE, APPEARANCEUR, LABSPEC, PHURINE, GLUCOSEU, HGBUR, BILIRUBINUR, KETONESUR, PROTEINUR, UROBILINOGEN, NITRITE, LEUKOCYTESUR Sepsis Labs Invalid input(s): PROCALCITONIN,  WBC,  LACTICIDVEN Microbiology Recent Results (from the past 240 hour(s))  Resp Panel by RT-PCR (Flu A&B,  Covid) Nasopharyngeal Swab     Status: None   Collection Time: 09/15/21  6:31 AM   Specimen: Nasopharyngeal Swab; Nasopharyngeal(NP) swabs in vial transport medium  Result Value Ref Range Status   SARS Coronavirus 2 by RT PCR NEGATIVE NEGATIVE Final    Comment: (NOTE) SARS-CoV-2 target nucleic acids are NOT DETECTED.  The SARS-CoV-2 RNA is generally detectable in upper respiratory specimens during the acute phase of infection. The lowest concentration of SARS-CoV-2 viral copies this assay can detect is 138 copies/mL. A negative result does not preclude SARS-Cov-2 infection and should not be used as the sole basis for treatment or other patient management decisions. A negative result may occur with  improper specimen collection/handling, submission of specimen other than nasopharyngeal swab, presence of viral mutation(s) within the areas targeted by this assay, and inadequate number of viral copies(<138 copies/mL). A negative result must be combined with clinical observations, patient history, and epidemiological information. The expected result is Negative.  Fact Sheet for Patients:  09/17/21  Fact Sheet for Healthcare Providers:  09/17/21  This test is no t yet approved or cleared by the BloggerCourse.com FDA and  has been authorized for detection and/or diagnosis of SARS-CoV-2 by FDA under an Emergency Use Authorization (EUA). This EUA will remain  in effect (meaning this test can be used) for the duration of the COVID-19 declaration under Section 564(b)(1) of the Act, 21 U.S.C.section 360bbb-3(b)(1), unless the authorization is terminated  or revoked sooner.       Influenza A by PCR NEGATIVE NEGATIVE Final   Influenza B  by PCR NEGATIVE NEGATIVE Final    Comment: (NOTE) The Xpert Xpress SARS-CoV-2/FLU/RSV plus assay is intended as an aid in the diagnosis of influenza from Nasopharyngeal swab specimens and should  not be used as a sole basis for treatment. Nasal washings and aspirates are unacceptable for Xpert Xpress SARS-CoV-2/FLU/RSV testing.  Fact Sheet for Patients: BloggerCourse.com  Fact Sheet for Healthcare Providers: SeriousBroker.it  This test is not yet approved or cleared by the [place] FDA and has been authorized for detection and/or diagnosis of SARS-CoV-2 by FDA under an Emergency Use Authorization (EUA). This EUA will remain in effect (meaning this test can be used) for the duration of the COVID-19 declaration under Section 564(b)(1) of the Act, 21 U.S.C. section 360bbb-3(b)(1), unless the authorization is terminated or revoked.  Performed at [facility], [address] [place], [place] [address]      Time coordinating discharge: Over 30 minutes  SIGNED:   Huey Bienenstock, MD  [facility] 09/19/2021, 11:32 AM Pager   If 7PM-7AM, please contact night-coverage [email or web address] Password [email or web address]

## 2021-09-19 NOTE — Progress Notes (Signed)
TCT patient in his room. Patient stated that Jerolyn Shin is working on putting him in a hotel after discharge. Patient gave me permission to call Jerolyn Shin. TCT Arick Mareno, cousin in [place] [phone]). Jerolyn Shin stated that he will put the patient in a hotel and try to "straighten out his home." He will provide transportation home after 12 noon. Alexis Goodell Transition of Care Supervisor [phone]

## 2021-09-19 NOTE — TOC Progression Note (Addendum)
Transition of Care [facility]) - Progression Note    Patient Details  Name: Scott Matthews MRN: [id number] Date of Birth: December 07, 1945  Transition of Care [facility] CM/SW Contact  Mearl Latin, LCSW Phone Number: 09/19/2021, 9:51 AM  Clinical Narrative:    CSW spoke with representative at [facility] [phone]). They reported that patient owes $658.20 and power cannot be turned on until it is paid in full. They received a payment of around $400 in November but that was not enough. CSW asked about resources for patient but they stated patient should follow up with Social Services and local agencies. They have no record of a letter from patient's PCP.   CSW left voicemail for APS to see if they can connect patient with any services.  Expected Discharge Plan: Home/Self Care Barriers to Discharge: Continued Medical Work up  Expected Discharge Plan and Services Expected Discharge Plan: Home/Self Care   Discharge Planning Services: CM Consult Post Acute Care Choice: Home Health Living arrangements for the past 2 months: Single Family Home Expected Discharge Date: 09/19/21                                     Social Determinants of Health (SDOH) Interventions    Readmission Risk Interventions No flowsheet data found.

## 2021-09-19 NOTE — Progress Notes (Signed)
°   09/19/21 1252  AVS Discharge Documentation  AVS Discharge Instructions Including Medications Provided to patient/caregiver  Name of Person Receiving AVS Discharge Instructions Including Medications Ardelle Anton  Name of Clinician That Reviewed AVS Discharge Instructions Including Medications Clarnce Flock, RN

## 2021-09-19 NOTE — Consult Note (Addendum)
° °  [facility] CM Inpatient Consult   09/19/2021  Scott Matthews [birth year] [id number]  [facility] [[facility]]  Accountable Care Organization [ACO] Patient: [employer]  Primary Care Provider:  Elfredia Nevins, MD [facility] spelling] [facility], Embedded provider private office  We have reviewed a referral request and this patient will be referred to the outside provider liaison for follow up. Hospital RN liaison has a request for contact.  Charlesetta Shanks, RN BSN CCM [facility]  [phone] business mobile phone Toll free office [phone]  Fax number: [phone] [email or web address]

## 2021-09-25 DIAGNOSIS — E119 Type 2 diabetes mellitus without complications: Secondary | ICD-10-CM | POA: Diagnosis not present

## 2021-09-25 DIAGNOSIS — G319 Degenerative disease of nervous system, unspecified: Secondary | ICD-10-CM | POA: Diagnosis not present

## 2021-09-25 DIAGNOSIS — S20219A Contusion of unspecified front wall of thorax, initial encounter: Secondary | ICD-10-CM | POA: Diagnosis not present

## 2021-09-25 DIAGNOSIS — M549 Dorsalgia, unspecified: Secondary | ICD-10-CM | POA: Diagnosis not present

## 2021-09-25 DIAGNOSIS — Z6823 Body mass index (BMI) 23.0-23.9, adult: Secondary | ICD-10-CM | POA: Diagnosis not present

## 2021-09-25 DIAGNOSIS — E663 Overweight: Secondary | ICD-10-CM | POA: Diagnosis not present

## 2021-09-25 DIAGNOSIS — T07XXXA Unspecified multiple injuries, initial encounter: Secondary | ICD-10-CM | POA: Diagnosis not present

## 2021-10-09 DIAGNOSIS — H401192 Primary open-angle glaucoma, unspecified eye, moderate stage: Secondary | ICD-10-CM | POA: Diagnosis not present

## 2021-10-10 DIAGNOSIS — Z111 Encounter for screening for respiratory tuberculosis: Secondary | ICD-10-CM | POA: Diagnosis not present

## 2021-10-12 DIAGNOSIS — Z0100 Encounter for examination of eyes and vision without abnormal findings: Secondary | ICD-10-CM | POA: Diagnosis not present

## 2021-10-25 DIAGNOSIS — I10 Essential (primary) hypertension: Secondary | ICD-10-CM | POA: Diagnosis not present

## 2021-10-25 DIAGNOSIS — G3189 Other specified degenerative diseases of nervous system: Secondary | ICD-10-CM | POA: Diagnosis not present

## 2021-10-25 DIAGNOSIS — E119 Type 2 diabetes mellitus without complications: Secondary | ICD-10-CM | POA: Diagnosis not present

## 2021-10-25 DIAGNOSIS — M545 Low back pain, unspecified: Secondary | ICD-10-CM | POA: Diagnosis not present

## 2021-10-25 DIAGNOSIS — R269 Unspecified abnormalities of gait and mobility: Secondary | ICD-10-CM | POA: Diagnosis not present

## 2021-10-25 DIAGNOSIS — K5901 Slow transit constipation: Secondary | ICD-10-CM | POA: Diagnosis not present

## 2021-11-24 DIAGNOSIS — Z1321 Encounter for screening for nutritional disorder: Secondary | ICD-10-CM | POA: Diagnosis not present

## 2021-11-24 DIAGNOSIS — E119 Type 2 diabetes mellitus without complications: Secondary | ICD-10-CM | POA: Diagnosis not present

## 2021-11-24 DIAGNOSIS — E559 Vitamin D deficiency, unspecified: Secondary | ICD-10-CM | POA: Diagnosis not present

## 2021-11-24 DIAGNOSIS — I10 Essential (primary) hypertension: Secondary | ICD-10-CM | POA: Diagnosis not present

## 2021-11-24 DIAGNOSIS — Z13220 Encounter for screening for lipoid disorders: Secondary | ICD-10-CM | POA: Diagnosis not present

## 2021-11-24 DIAGNOSIS — D519 Vitamin B12 deficiency anemia, unspecified: Secondary | ICD-10-CM | POA: Diagnosis not present

## 2021-11-24 DIAGNOSIS — D529 Folate deficiency anemia, unspecified: Secondary | ICD-10-CM | POA: Diagnosis not present

## 2021-12-12 DIAGNOSIS — M79675 Pain in left toe(s): Secondary | ICD-10-CM | POA: Diagnosis not present

## 2021-12-12 DIAGNOSIS — B351 Tinea unguium: Secondary | ICD-10-CM | POA: Diagnosis not present

## 2022-01-08 DIAGNOSIS — H401131 Primary open-angle glaucoma, bilateral, mild stage: Secondary | ICD-10-CM | POA: Diagnosis not present

## 2022-01-25 DIAGNOSIS — Z6825 Body mass index (BMI) 25.0-25.9, adult: Secondary | ICD-10-CM | POA: Diagnosis not present

## 2022-01-25 DIAGNOSIS — E663 Overweight: Secondary | ICD-10-CM | POA: Diagnosis not present

## 2022-01-25 DIAGNOSIS — Z433 Encounter for attention to colostomy: Secondary | ICD-10-CM | POA: Diagnosis not present

## 2022-02-01 ENCOUNTER — Ambulatory Visit (INDEPENDENT_AMBULATORY_CARE_PROVIDER_SITE_OTHER): Payer: Medicare HMO | Admitting: General Surgery

## 2022-02-01 ENCOUNTER — Encounter: Payer: Self-pay | Admitting: General Surgery

## 2022-02-01 VITALS — BP 156/90 | HR 54 | Temp 98.3°F | Resp 14 | Ht 67.0 in | Wt 161.0 lb

## 2022-02-01 DIAGNOSIS — Z433 Encounter for attention to colostomy: Secondary | ICD-10-CM | POA: Diagnosis not present

## 2022-02-02 NOTE — Progress Notes (Signed)
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  He underwent a Hartman's procedure in the remote past for perforated sigmoid colon.  He has had the colostomy since that time and apparently was lost to follow-up.  He has never considered having his colostomy reversed.  He lives in a group home.  He has had some bleeding from the colostomy site superficially.  He has not had a colonoscopy.  He denies any weight changes. Past Medical History:  Diagnosis Date   Hypertension    Medical history non-contributory     Past Surgical History:  Procedure Laterality Date   COLON SURGERY     COLOSTOMY      Family History  Problem Relation Age of Onset   Hypertension Mother    Hypertension Father    CAD Father     Current Outpatient Medications on File Prior to Visit  Medication Sig Dispense Refill   amLODipine (NORVASC) 5 MG tablet Take 1 tablet (5 mg total) by mouth daily. 30 tablet 0   benazepril-hydrochlorthiazide (LOTENSIN HCT) 10-12.5 MG tablet Take 1 tablet by mouth daily. 30 tablet 0   latanoprost (XALATAN) 0.005 % ophthalmic solution      metoprolol tartrate (LOPRESSOR) 50 MG tablet Take by mouth.     traMADol (ULTRAM) 50 MG tablet Take by mouth.     No current facility-administered medications on file prior to visit.    No Known Allergies  Social History   Substance and Sexual Activity  Alcohol Use No    Social History   Tobacco Use  Smoking Status Never  Smokeless Tobacco Never    Review of Systems  Constitutional: Negative.   HENT:  Positive for sinus pain.   Eyes:  Positive for blurred vision.  Respiratory: Negative.    Cardiovascular: Negative.   Gastrointestinal: Negative.   Genitourinary: Negative.   Musculoskeletal:  Positive for back pain.  Skin: Negative.   Neurological: Negative.   Endo/Heme/Allergies: Negative.    Psychiatric/Behavioral: Negative.     Objective   Vitals:   02/01/22 1039  BP: (!) 156/90  Pulse: (!) 54  Resp: 14  Temp: 98.3 F (36.8 C)  SpO2: 96%    Physical Exam Vitals reviewed.  Constitutional:      Appearance: Normal appearance. He is normal weight. He is not ill-appearing.  HENT:     Head: Normocephalic and atraumatic.  Cardiovascular:     Rate and Rhythm: Normal rate and regular rhythm.     Heart sounds: Normal heart sounds. No murmur heard.   No friction rub. No gallop.  Pulmonary:     Effort: Pulmonary effort is normal. No respiratory distress.     Breath sounds: Normal breath sounds. No stridor. No wheezing, rhonchi or rales.  Abdominal:     General: Abdomen is flat. Bowel sounds are normal. There is no distension.     Palpations: Abdomen is soft. There is no mass.     Tenderness: There is no abdominal tenderness. There is no guarding or rebound.     Hernia: No hernia is present.     Comments: A colostomy is present along the left side of the abdomen.  Friable granulation tissue is noted to be emanating from the ostomy site.  Is easily irritated and bleeds.  No obvious parastomal hernia present.  A well-healed midline incision is present.  Skin:  General: Skin is warm and dry.  Neurological:     Mental Status: He is alert and oriented to person, place, and time.   Records from colostomy procedure not found. Assessment  Status post Hartmann's procedure in the remote past Plan  I discussed with the patient that I could work him up for colostomy reversal or revise the colostomy.  The granulation tissue at the ostomy site is too extensive to treat locally.  He would like to think about the procedure and will return in 2 weeks for follow-up.

## 2022-02-15 ENCOUNTER — Encounter: Payer: Self-pay | Admitting: General Surgery

## 2022-02-15 ENCOUNTER — Ambulatory Visit (INDEPENDENT_AMBULATORY_CARE_PROVIDER_SITE_OTHER): Payer: Medicare HMO | Admitting: General Surgery

## 2022-02-15 VITALS — BP 140/78 | HR 47 | Temp 97.1°F | Resp 14 | Ht 67.0 in | Wt 162.0 lb

## 2022-02-15 DIAGNOSIS — Z933 Colostomy status: Secondary | ICD-10-CM

## 2022-02-15 NOTE — Progress Notes (Signed)
Subjective:     Scott Matthews  Patient here for follow-up colostomy care.  He has had no further bleeding from his colostomy site. Objective:    BP 140/78   Pulse (!) 47   Temp (!) 97.1 F (36.2 C) (Other (Comment))   Resp 14   Ht 5\' 7"  (1.702 m)   Wt 162 lb (73.5 kg)   SpO2 97%   BMI 25.37 kg/m   General:  alert, cooperative, and no distress  Abdomen soft.  Colostomy still with granulation polypoid lesions on the surface without bleeding.     Assessment:    Status post Hartman's procedure in the remote past for perforated sigmoid colon    Plan:   At this point, patient want anything further done.  Did explain the multiple options including no surgery, colostomy revision, and colostomy reversal.  He understands the risks and benefits of all the procedures.  He would like to think about it and will return to my care if needed.

## 2022-04-06 DIAGNOSIS — E119 Type 2 diabetes mellitus without complications: Secondary | ICD-10-CM | POA: Diagnosis not present

## 2022-04-06 DIAGNOSIS — I10 Essential (primary) hypertension: Secondary | ICD-10-CM | POA: Diagnosis not present

## 2022-04-09 DIAGNOSIS — H401131 Primary open-angle glaucoma, bilateral, mild stage: Secondary | ICD-10-CM | POA: Diagnosis not present

## 2022-05-08 ENCOUNTER — Ambulatory Visit (INDEPENDENT_AMBULATORY_CARE_PROVIDER_SITE_OTHER): Payer: Medicare HMO | Admitting: General Surgery

## 2022-05-08 ENCOUNTER — Encounter: Payer: Self-pay | Admitting: General Surgery

## 2022-05-08 VITALS — BP 151/92 | HR 56 | Temp 98.0°F | Resp 18 | Ht 67.0 in | Wt 170.0 lb

## 2022-05-08 DIAGNOSIS — Z933 Colostomy status: Secondary | ICD-10-CM

## 2022-05-08 NOTE — Progress Notes (Signed)
Subjective:     Scott Matthews  Patient presents back for discussion of colostomy reversal.  He has had no issues with his colostomy function.  He would like to strongly consider getting his colostomy reversed later this year.  He does live in I believe a group home in [place]. Objective:    BP (!) 151/92   Pulse (!) 56   Temp 98 F (36.7 C) (Oral)   Resp 18   Ht 5\' 7"  (1.702 m)   Wt 170 lb (77.1 kg)   SpO2 97%   BMI 26.63 kg/m   General:  alert, cooperative, and no distress  Lungs clear to auscultation with equal breath sounds bilaterally Heart examination reveals regular rate and rhythm without S3, S4, murmurs Abdomen soft with a well-healed midline surgical scar.  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  It is not actively bleeding at this time.     Assessment:    Status post Hartman's procedure in the remote past    Plan:   I discussed with him the need for a colonoscopy through his ostomy as well as a rectal examination prior to colostomy reversal.  This is usually done in a two-stage manner.  As he would like to have his colostomy reversal done in October, he will be coming back to my office on 06/19/2022 to go over the surgery and to be prescribed bowel preparation.  He understands this and agrees.

## 2022-05-23 DIAGNOSIS — M79674 Pain in right toe(s): Secondary | ICD-10-CM | POA: Diagnosis not present

## 2022-05-23 DIAGNOSIS — B351 Tinea unguium: Secondary | ICD-10-CM | POA: Diagnosis not present

## 2022-05-23 DIAGNOSIS — M79675 Pain in left toe(s): Secondary | ICD-10-CM | POA: Diagnosis not present

## 2022-05-28 DIAGNOSIS — H524 Presbyopia: Secondary | ICD-10-CM | POA: Diagnosis not present

## 2022-05-30 DIAGNOSIS — K9403 Colostomy malfunction: Secondary | ICD-10-CM | POA: Diagnosis not present

## 2022-05-30 DIAGNOSIS — R269 Unspecified abnormalities of gait and mobility: Secondary | ICD-10-CM | POA: Diagnosis not present

## 2022-05-30 DIAGNOSIS — I10 Essential (primary) hypertension: Secondary | ICD-10-CM | POA: Diagnosis not present

## 2022-05-30 DIAGNOSIS — H26223 Cataract secondary to ocular disorders (degenerative) (inflammatory), bilateral: Secondary | ICD-10-CM | POA: Diagnosis not present

## 2022-06-19 ENCOUNTER — Ambulatory Visit: Payer: Medicare HMO | Admitting: General Surgery

## 2022-06-19 DIAGNOSIS — H25812 Combined forms of age-related cataract, left eye: Secondary | ICD-10-CM | POA: Diagnosis not present

## 2022-07-02 DIAGNOSIS — H25812 Combined forms of age-related cataract, left eye: Secondary | ICD-10-CM | POA: Diagnosis not present

## 2022-07-02 DIAGNOSIS — I10 Essential (primary) hypertension: Secondary | ICD-10-CM | POA: Diagnosis not present

## 2022-07-20 IMAGING — CT CT HEAD W/O CM
3 series · 15 of 47 positions shown, 18 images · non-contrast
Comparison: None.

CLINICAL DATA: Delirium

EXAM:
CT HEAD WITHOUT CONTRAST
TECHNIQUE: Contiguous axial images were obtained from the base of the skull
through the vertex without intravenous contrast.

[Series 2: head w o · axial · 0.48mm/px · z∈[+44,+179]mm · 9 of 33 slices shown, 12 images]
[im 3/33  brain]
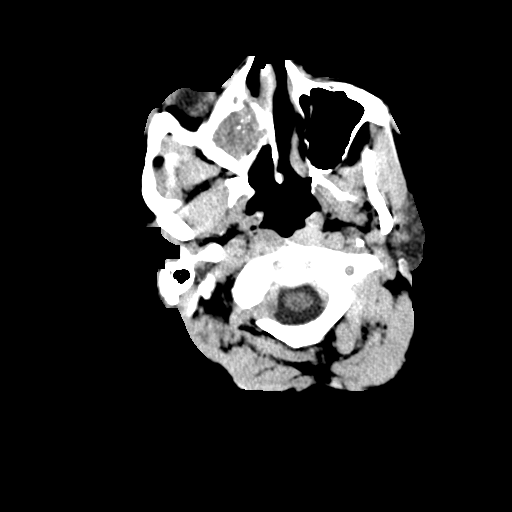
[im 3/33  bone]
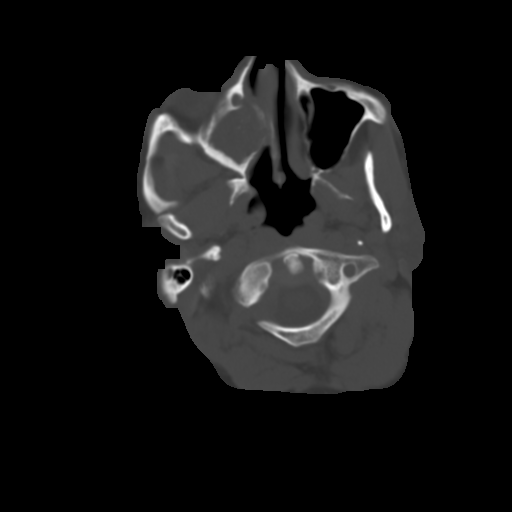
[im 6/33  brain]
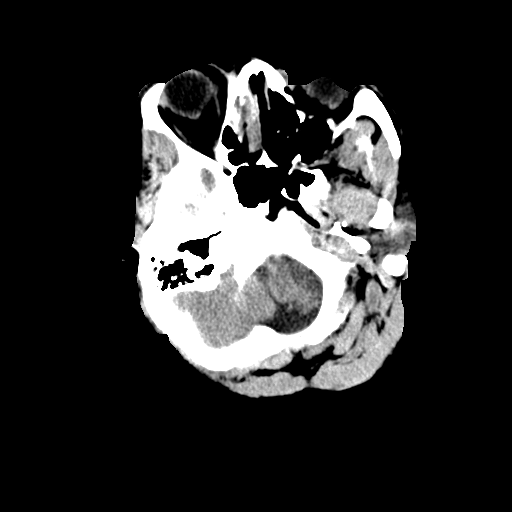
[im 9/33  brain]
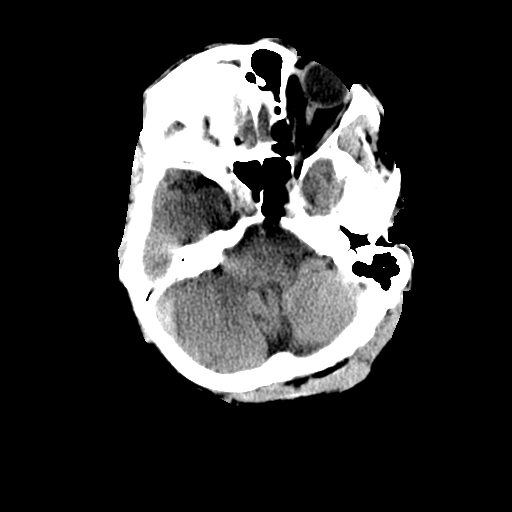
[im 13/33  brain]
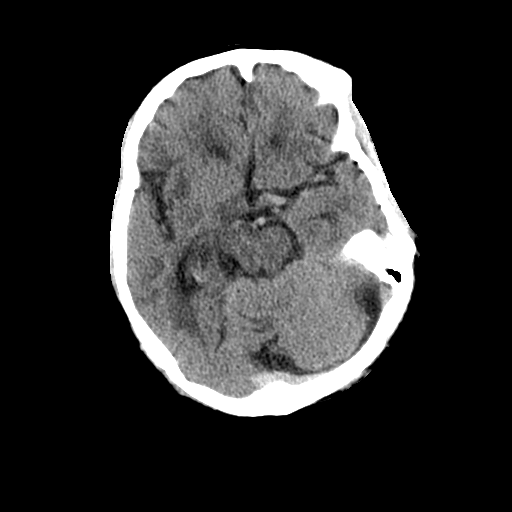
[im 17/33  brain]
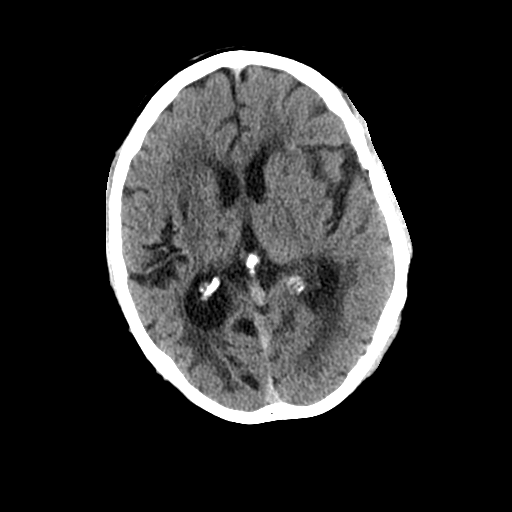
[im 17/33  bone]
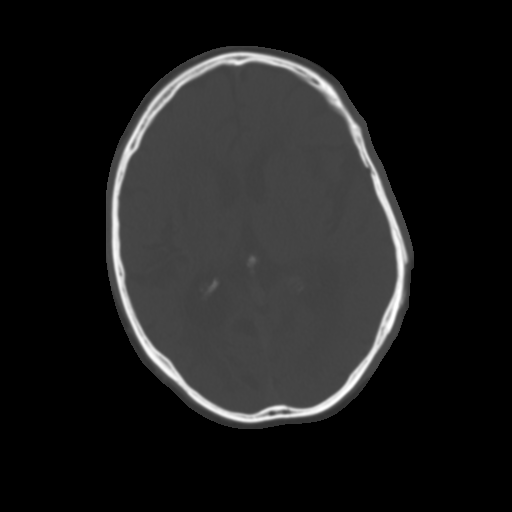
[im 20/33  brain]
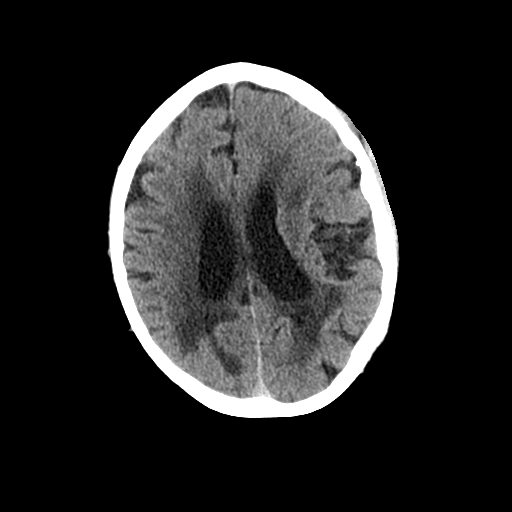
[im 24/33  brain]
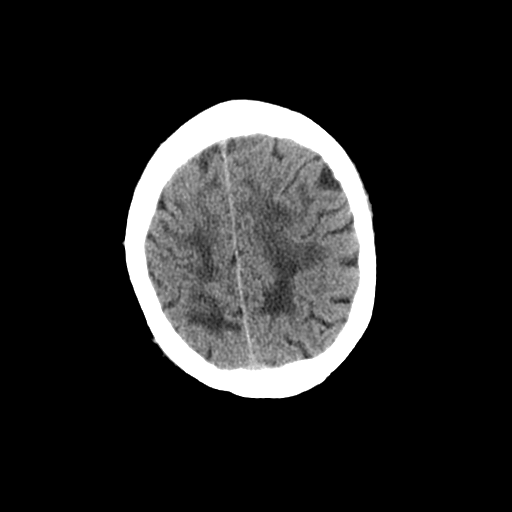
[im 27/33  brain]
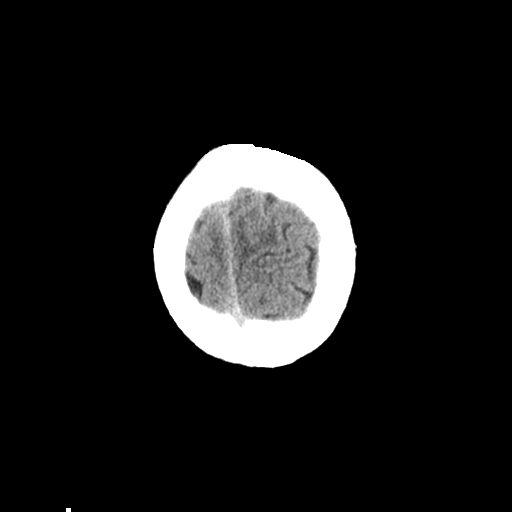
[im 30/33  brain]
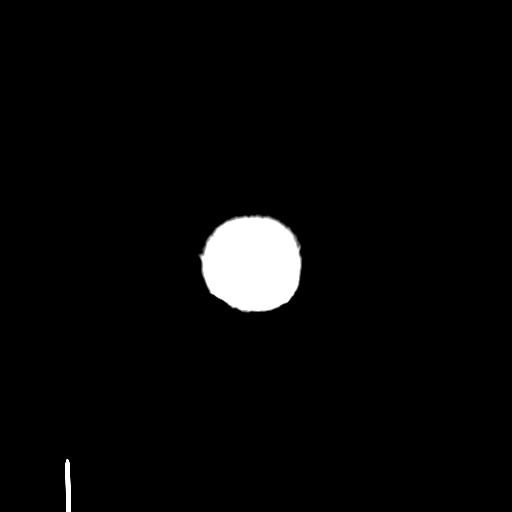
[im 30/33  bone]
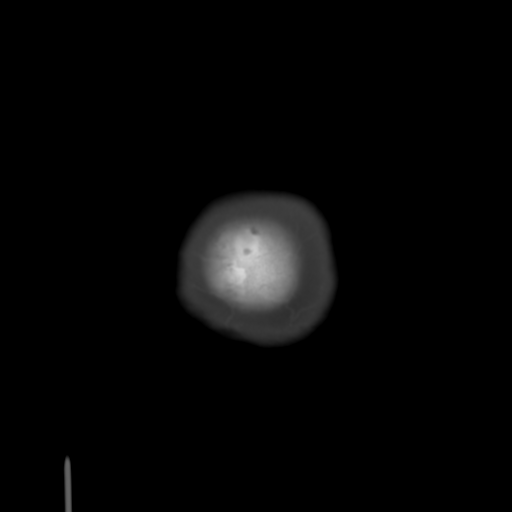

[Series 4: coronal soft · coronal · 0.39mm/px · 3 of 84 slices shown]
[im 28/84  brain]
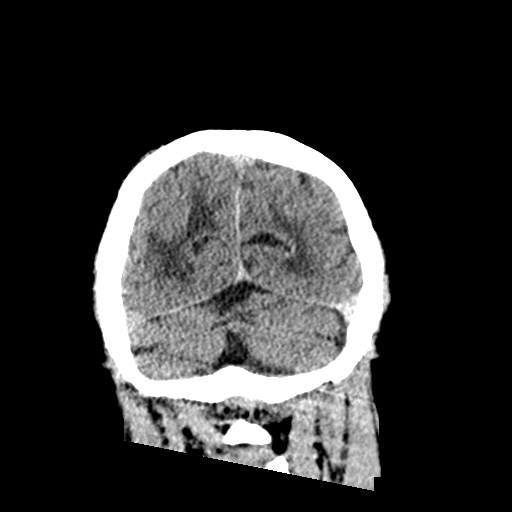
[im 37/84  brain]
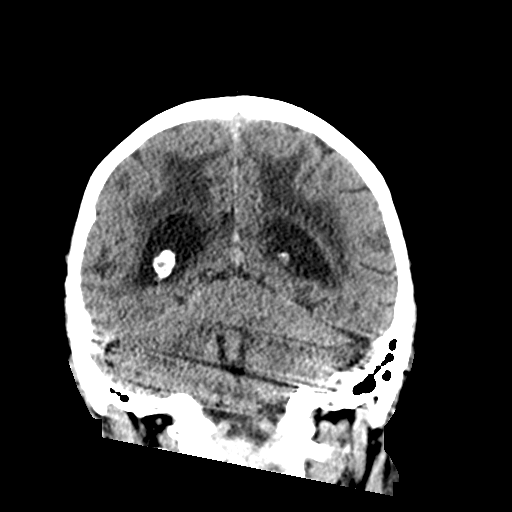
[im 47/84  brain]
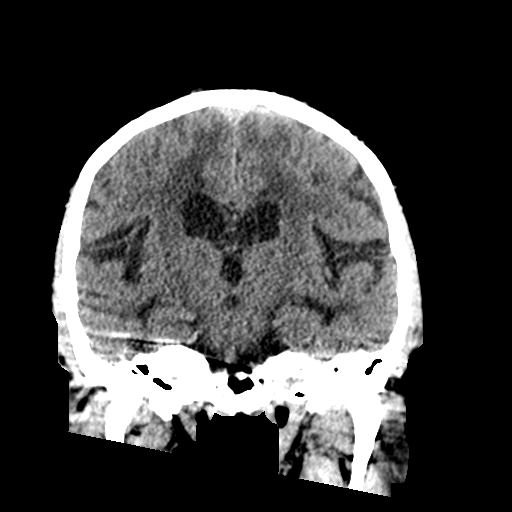

[Series 5: sagittal soft · sagittal · 0.37mm/px · 3 of 66 slices shown]
[im 22/66  brain]
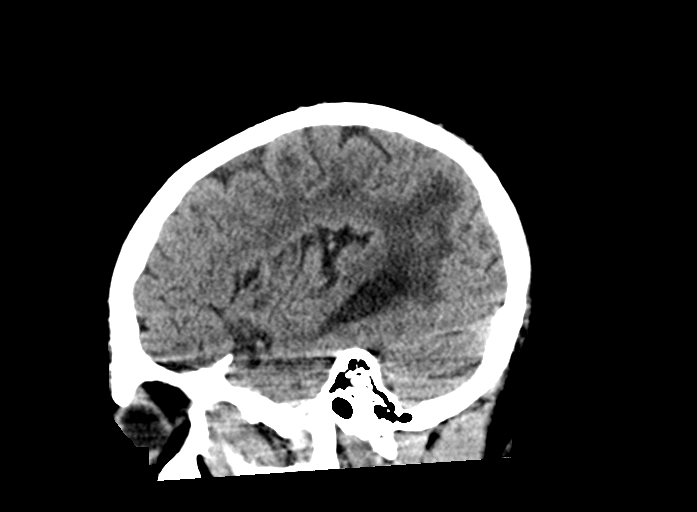
[im 33/66  brain]
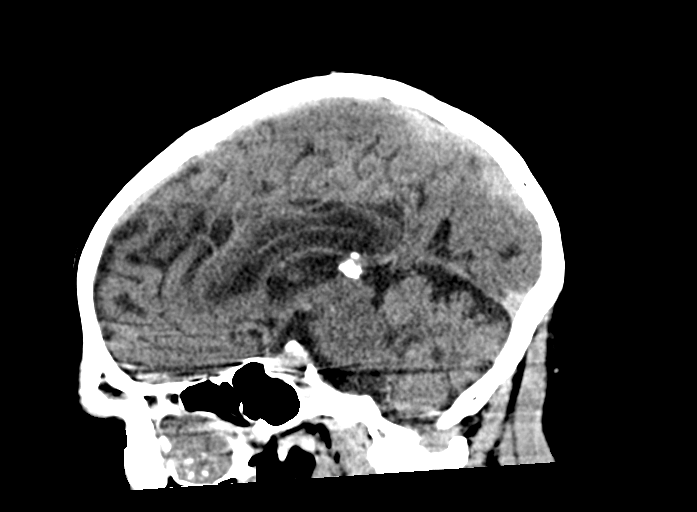
[im 44/66  brain]
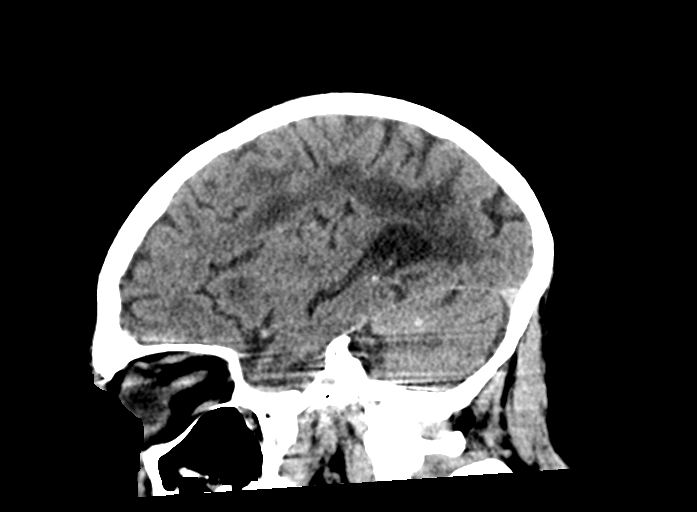

[15 of 47 positions shown; findings below may reference images not displayed]

FINDINGS: Brain: There is no mass, hemorrhage or extra-axial collection. There
is generalized atrophy without lobar predilection. Hypodensity of
the white matter is most commonly associated with chronic
microvascular disease. Unchanged hyperdense focus in the left
cerebellum.

Vascular: No abnormal hyperdensity of the major intracranial
arteries or dural venous sinuses. No intracranial atherosclerosis.

Skull: The visualized skull base, calvarium and extracranial soft
tissues are normal.

Sinuses/Orbits: Chronic right maxillary sinusitis. The orbits are
normal.
IMPRESSION: 1. No acute intracranial abnormality.
2. Advanced chronic microvascular ischemic changes of the white
matter.
3.  Cerebral Atrophy ([id number]).

## 2022-07-31 ENCOUNTER — Encounter: Payer: Medicare HMO | Admitting: General Surgery

## 2022-08-07 DIAGNOSIS — B351 Tinea unguium: Secondary | ICD-10-CM | POA: Diagnosis not present

## 2022-08-07 DIAGNOSIS — M79675 Pain in left toe(s): Secondary | ICD-10-CM | POA: Diagnosis not present

## 2022-08-07 DIAGNOSIS — M79674 Pain in right toe(s): Secondary | ICD-10-CM | POA: Diagnosis not present

## 2022-09-18 DIAGNOSIS — I10 Essential (primary) hypertension: Secondary | ICD-10-CM | POA: Diagnosis not present

## 2022-10-23 DIAGNOSIS — K9403 Colostomy malfunction: Secondary | ICD-10-CM | POA: Diagnosis not present

## 2022-10-23 DIAGNOSIS — I10 Essential (primary) hypertension: Secondary | ICD-10-CM | POA: Diagnosis not present

## 2022-10-23 DIAGNOSIS — H26223 Cataract secondary to ocular disorders (degenerative) (inflammatory), bilateral: Secondary | ICD-10-CM | POA: Diagnosis not present

## 2022-10-23 DIAGNOSIS — F01A0 Vascular dementia, mild, without behavioral disturbance, psychotic disturbance, mood disturbance, and anxiety: Secondary | ICD-10-CM | POA: Diagnosis not present

## 2022-10-23 DIAGNOSIS — R269 Unspecified abnormalities of gait and mobility: Secondary | ICD-10-CM | POA: Diagnosis not present

## 2022-11-02 DIAGNOSIS — D2261 Melanocytic nevi of right upper limb, including shoulder: Secondary | ICD-10-CM | POA: Diagnosis not present

## 2022-11-02 DIAGNOSIS — L821 Other seborrheic keratosis: Secondary | ICD-10-CM | POA: Diagnosis not present

## 2022-11-02 DIAGNOSIS — D2272 Melanocytic nevi of left lower limb, including hip: Secondary | ICD-10-CM | POA: Diagnosis not present

## 2022-11-02 DIAGNOSIS — D225 Melanocytic nevi of trunk: Secondary | ICD-10-CM | POA: Diagnosis not present

## 2022-11-02 DIAGNOSIS — D2271 Melanocytic nevi of right lower limb, including hip: Secondary | ICD-10-CM | POA: Diagnosis not present

## 2022-11-02 DIAGNOSIS — D2262 Melanocytic nevi of left upper limb, including shoulder: Secondary | ICD-10-CM | POA: Diagnosis not present

## 2022-11-02 DIAGNOSIS — L853 Xerosis cutis: Secondary | ICD-10-CM | POA: Diagnosis not present

## 2022-11-13 DIAGNOSIS — G4700 Insomnia, unspecified: Secondary | ICD-10-CM | POA: Diagnosis not present

## 2022-11-21 DIAGNOSIS — G4700 Insomnia, unspecified: Secondary | ICD-10-CM | POA: Diagnosis not present

## 2022-11-21 DIAGNOSIS — H26223 Cataract secondary to ocular disorders (degenerative) (inflammatory), bilateral: Secondary | ICD-10-CM | POA: Diagnosis not present

## 2022-11-21 DIAGNOSIS — F01A2 Vascular dementia, mild, with psychotic disturbance: Secondary | ICD-10-CM | POA: Diagnosis not present

## 2022-11-21 DIAGNOSIS — I10 Essential (primary) hypertension: Secondary | ICD-10-CM | POA: Diagnosis not present

## 2022-11-21 DIAGNOSIS — R269 Unspecified abnormalities of gait and mobility: Secondary | ICD-10-CM | POA: Diagnosis not present

## 2022-11-21 DIAGNOSIS — K9403 Colostomy malfunction: Secondary | ICD-10-CM | POA: Diagnosis not present

## 2022-11-22 DIAGNOSIS — I10 Essential (primary) hypertension: Secondary | ICD-10-CM | POA: Diagnosis not present

## 2022-11-28 DIAGNOSIS — H26223 Cataract secondary to ocular disorders (degenerative) (inflammatory), bilateral: Secondary | ICD-10-CM | POA: Diagnosis not present

## 2022-11-28 DIAGNOSIS — F01A0 Vascular dementia, mild, without behavioral disturbance, psychotic disturbance, mood disturbance, and anxiety: Secondary | ICD-10-CM | POA: Diagnosis not present

## 2022-11-28 DIAGNOSIS — R269 Unspecified abnormalities of gait and mobility: Secondary | ICD-10-CM | POA: Diagnosis not present

## 2022-11-28 DIAGNOSIS — G4700 Insomnia, unspecified: Secondary | ICD-10-CM | POA: Diagnosis not present

## 2022-11-28 DIAGNOSIS — I10 Essential (primary) hypertension: Secondary | ICD-10-CM | POA: Diagnosis not present

## 2022-11-28 DIAGNOSIS — K9403 Colostomy malfunction: Secondary | ICD-10-CM | POA: Diagnosis not present

## 2022-11-29 DIAGNOSIS — E119 Type 2 diabetes mellitus without complications: Secondary | ICD-10-CM | POA: Diagnosis not present

## 2022-11-29 DIAGNOSIS — I10 Essential (primary) hypertension: Secondary | ICD-10-CM | POA: Diagnosis not present

## 2022-12-03 DIAGNOSIS — G4700 Insomnia, unspecified: Secondary | ICD-10-CM | POA: Diagnosis not present

## 2022-12-03 DIAGNOSIS — Z4800 Encounter for change or removal of nonsurgical wound dressing: Secondary | ICD-10-CM | POA: Diagnosis not present

## 2022-12-03 DIAGNOSIS — L97212 Non-pressure chronic ulcer of right calf with fat layer exposed: Secondary | ICD-10-CM | POA: Diagnosis not present

## 2022-12-03 DIAGNOSIS — G3189 Other specified degenerative diseases of nervous system: Secondary | ICD-10-CM | POA: Diagnosis not present

## 2022-12-03 DIAGNOSIS — I70232 Atherosclerosis of native arteries of right leg with ulceration of calf: Secondary | ICD-10-CM | POA: Diagnosis not present

## 2022-12-03 DIAGNOSIS — F01A18 Vascular dementia, mild, with other behavioral disturbance: Secondary | ICD-10-CM | POA: Diagnosis not present

## 2022-12-03 DIAGNOSIS — E1151 Type 2 diabetes mellitus with diabetic peripheral angiopathy without gangrene: Secondary | ICD-10-CM | POA: Diagnosis not present

## 2022-12-03 DIAGNOSIS — K9403 Colostomy malfunction: Secondary | ICD-10-CM | POA: Diagnosis not present

## 2022-12-03 DIAGNOSIS — F02818 Dementia in other diseases classified elsewhere, unspecified severity, with other behavioral disturbance: Secondary | ICD-10-CM | POA: Diagnosis not present

## 2022-12-05 DIAGNOSIS — I70232 Atherosclerosis of native arteries of right leg with ulceration of calf: Secondary | ICD-10-CM | POA: Diagnosis not present

## 2022-12-05 DIAGNOSIS — Z4800 Encounter for change or removal of nonsurgical wound dressing: Secondary | ICD-10-CM | POA: Diagnosis not present

## 2022-12-05 DIAGNOSIS — F02818 Dementia in other diseases classified elsewhere, unspecified severity, with other behavioral disturbance: Secondary | ICD-10-CM | POA: Diagnosis not present

## 2022-12-05 DIAGNOSIS — F01A18 Vascular dementia, mild, with other behavioral disturbance: Secondary | ICD-10-CM | POA: Diagnosis not present

## 2022-12-05 DIAGNOSIS — G4700 Insomnia, unspecified: Secondary | ICD-10-CM | POA: Diagnosis not present

## 2022-12-05 DIAGNOSIS — L97212 Non-pressure chronic ulcer of right calf with fat layer exposed: Secondary | ICD-10-CM | POA: Diagnosis not present

## 2022-12-05 DIAGNOSIS — E1151 Type 2 diabetes mellitus with diabetic peripheral angiopathy without gangrene: Secondary | ICD-10-CM | POA: Diagnosis not present

## 2022-12-05 DIAGNOSIS — G3189 Other specified degenerative diseases of nervous system: Secondary | ICD-10-CM | POA: Diagnosis not present

## 2022-12-05 DIAGNOSIS — K9403 Colostomy malfunction: Secondary | ICD-10-CM | POA: Diagnosis not present

## 2022-12-11 DIAGNOSIS — G3189 Other specified degenerative diseases of nervous system: Secondary | ICD-10-CM | POA: Diagnosis not present

## 2022-12-11 DIAGNOSIS — I70232 Atherosclerosis of native arteries of right leg with ulceration of calf: Secondary | ICD-10-CM | POA: Diagnosis not present

## 2022-12-11 DIAGNOSIS — I10 Essential (primary) hypertension: Secondary | ICD-10-CM | POA: Diagnosis not present

## 2022-12-11 DIAGNOSIS — F02818 Dementia in other diseases classified elsewhere, unspecified severity, with other behavioral disturbance: Secondary | ICD-10-CM | POA: Diagnosis not present

## 2022-12-11 DIAGNOSIS — G4700 Insomnia, unspecified: Secondary | ICD-10-CM | POA: Diagnosis not present

## 2022-12-11 DIAGNOSIS — K9403 Colostomy malfunction: Secondary | ICD-10-CM | POA: Diagnosis not present

## 2022-12-11 DIAGNOSIS — F01A18 Vascular dementia, mild, with other behavioral disturbance: Secondary | ICD-10-CM | POA: Diagnosis not present

## 2022-12-11 DIAGNOSIS — Z4800 Encounter for change or removal of nonsurgical wound dressing: Secondary | ICD-10-CM | POA: Diagnosis not present

## 2022-12-11 DIAGNOSIS — F5105 Insomnia due to other mental disorder: Secondary | ICD-10-CM | POA: Diagnosis not present

## 2022-12-11 DIAGNOSIS — L97212 Non-pressure chronic ulcer of right calf with fat layer exposed: Secondary | ICD-10-CM | POA: Diagnosis not present

## 2022-12-11 DIAGNOSIS — E1151 Type 2 diabetes mellitus with diabetic peripheral angiopathy without gangrene: Secondary | ICD-10-CM | POA: Diagnosis not present

## 2022-12-11 DIAGNOSIS — F01B2 Vascular dementia, moderate, with psychotic disturbance: Secondary | ICD-10-CM | POA: Diagnosis not present

## 2022-12-13 DIAGNOSIS — L97212 Non-pressure chronic ulcer of right calf with fat layer exposed: Secondary | ICD-10-CM | POA: Diagnosis not present

## 2022-12-13 DIAGNOSIS — G4700 Insomnia, unspecified: Secondary | ICD-10-CM | POA: Diagnosis not present

## 2022-12-13 DIAGNOSIS — G3189 Other specified degenerative diseases of nervous system: Secondary | ICD-10-CM | POA: Diagnosis not present

## 2022-12-13 DIAGNOSIS — E1151 Type 2 diabetes mellitus with diabetic peripheral angiopathy without gangrene: Secondary | ICD-10-CM | POA: Diagnosis not present

## 2022-12-13 DIAGNOSIS — I70232 Atherosclerosis of native arteries of right leg with ulceration of calf: Secondary | ICD-10-CM | POA: Diagnosis not present

## 2022-12-13 DIAGNOSIS — F01A18 Vascular dementia, mild, with other behavioral disturbance: Secondary | ICD-10-CM | POA: Diagnosis not present

## 2022-12-13 DIAGNOSIS — F02818 Dementia in other diseases classified elsewhere, unspecified severity, with other behavioral disturbance: Secondary | ICD-10-CM | POA: Diagnosis not present

## 2022-12-13 DIAGNOSIS — K9403 Colostomy malfunction: Secondary | ICD-10-CM | POA: Diagnosis not present

## 2022-12-13 DIAGNOSIS — Z4800 Encounter for change or removal of nonsurgical wound dressing: Secondary | ICD-10-CM | POA: Diagnosis not present

## 2022-12-17 DIAGNOSIS — Z4800 Encounter for change or removal of nonsurgical wound dressing: Secondary | ICD-10-CM | POA: Diagnosis not present

## 2022-12-17 DIAGNOSIS — G4700 Insomnia, unspecified: Secondary | ICD-10-CM | POA: Diagnosis not present

## 2022-12-17 DIAGNOSIS — I70232 Atherosclerosis of native arteries of right leg with ulceration of calf: Secondary | ICD-10-CM | POA: Diagnosis not present

## 2022-12-17 DIAGNOSIS — K9403 Colostomy malfunction: Secondary | ICD-10-CM | POA: Diagnosis not present

## 2022-12-17 DIAGNOSIS — G3189 Other specified degenerative diseases of nervous system: Secondary | ICD-10-CM | POA: Diagnosis not present

## 2022-12-17 DIAGNOSIS — F02818 Dementia in other diseases classified elsewhere, unspecified severity, with other behavioral disturbance: Secondary | ICD-10-CM | POA: Diagnosis not present

## 2022-12-17 DIAGNOSIS — L97212 Non-pressure chronic ulcer of right calf with fat layer exposed: Secondary | ICD-10-CM | POA: Diagnosis not present

## 2022-12-17 DIAGNOSIS — F01A18 Vascular dementia, mild, with other behavioral disturbance: Secondary | ICD-10-CM | POA: Diagnosis not present

## 2022-12-17 DIAGNOSIS — E1151 Type 2 diabetes mellitus with diabetic peripheral angiopathy without gangrene: Secondary | ICD-10-CM | POA: Diagnosis not present

## 2022-12-20 DIAGNOSIS — K9403 Colostomy malfunction: Secondary | ICD-10-CM | POA: Diagnosis not present

## 2022-12-20 DIAGNOSIS — Z4800 Encounter for change or removal of nonsurgical wound dressing: Secondary | ICD-10-CM | POA: Diagnosis not present

## 2022-12-20 DIAGNOSIS — F01A18 Vascular dementia, mild, with other behavioral disturbance: Secondary | ICD-10-CM | POA: Diagnosis not present

## 2022-12-20 DIAGNOSIS — G3189 Other specified degenerative diseases of nervous system: Secondary | ICD-10-CM | POA: Diagnosis not present

## 2022-12-20 DIAGNOSIS — F02818 Dementia in other diseases classified elsewhere, unspecified severity, with other behavioral disturbance: Secondary | ICD-10-CM | POA: Diagnosis not present

## 2022-12-20 DIAGNOSIS — L97212 Non-pressure chronic ulcer of right calf with fat layer exposed: Secondary | ICD-10-CM | POA: Diagnosis not present

## 2022-12-20 DIAGNOSIS — E1151 Type 2 diabetes mellitus with diabetic peripheral angiopathy without gangrene: Secondary | ICD-10-CM | POA: Diagnosis not present

## 2022-12-20 DIAGNOSIS — G4700 Insomnia, unspecified: Secondary | ICD-10-CM | POA: Diagnosis not present

## 2022-12-20 DIAGNOSIS — I70232 Atherosclerosis of native arteries of right leg with ulceration of calf: Secondary | ICD-10-CM | POA: Diagnosis not present

## 2022-12-24 DIAGNOSIS — F01A18 Vascular dementia, mild, with other behavioral disturbance: Secondary | ICD-10-CM | POA: Diagnosis not present

## 2022-12-24 DIAGNOSIS — G4700 Insomnia, unspecified: Secondary | ICD-10-CM | POA: Diagnosis not present

## 2022-12-24 DIAGNOSIS — G3189 Other specified degenerative diseases of nervous system: Secondary | ICD-10-CM | POA: Diagnosis not present

## 2022-12-24 DIAGNOSIS — Z4800 Encounter for change or removal of nonsurgical wound dressing: Secondary | ICD-10-CM | POA: Diagnosis not present

## 2022-12-24 DIAGNOSIS — F02818 Dementia in other diseases classified elsewhere, unspecified severity, with other behavioral disturbance: Secondary | ICD-10-CM | POA: Diagnosis not present

## 2022-12-24 DIAGNOSIS — L97212 Non-pressure chronic ulcer of right calf with fat layer exposed: Secondary | ICD-10-CM | POA: Diagnosis not present

## 2022-12-24 DIAGNOSIS — I70232 Atherosclerosis of native arteries of right leg with ulceration of calf: Secondary | ICD-10-CM | POA: Diagnosis not present

## 2022-12-24 DIAGNOSIS — E1151 Type 2 diabetes mellitus with diabetic peripheral angiopathy without gangrene: Secondary | ICD-10-CM | POA: Diagnosis not present

## 2022-12-24 DIAGNOSIS — K9403 Colostomy malfunction: Secondary | ICD-10-CM | POA: Diagnosis not present

## 2022-12-27 DIAGNOSIS — G3189 Other specified degenerative diseases of nervous system: Secondary | ICD-10-CM | POA: Diagnosis not present

## 2022-12-27 DIAGNOSIS — K9403 Colostomy malfunction: Secondary | ICD-10-CM | POA: Diagnosis not present

## 2022-12-27 DIAGNOSIS — F01A18 Vascular dementia, mild, with other behavioral disturbance: Secondary | ICD-10-CM | POA: Diagnosis not present

## 2022-12-27 DIAGNOSIS — E1151 Type 2 diabetes mellitus with diabetic peripheral angiopathy without gangrene: Secondary | ICD-10-CM | POA: Diagnosis not present

## 2022-12-27 DIAGNOSIS — Z4800 Encounter for change or removal of nonsurgical wound dressing: Secondary | ICD-10-CM | POA: Diagnosis not present

## 2022-12-27 DIAGNOSIS — I70232 Atherosclerosis of native arteries of right leg with ulceration of calf: Secondary | ICD-10-CM | POA: Diagnosis not present

## 2022-12-27 DIAGNOSIS — L97212 Non-pressure chronic ulcer of right calf with fat layer exposed: Secondary | ICD-10-CM | POA: Diagnosis not present

## 2022-12-27 DIAGNOSIS — F02818 Dementia in other diseases classified elsewhere, unspecified severity, with other behavioral disturbance: Secondary | ICD-10-CM | POA: Diagnosis not present

## 2022-12-27 DIAGNOSIS — G4700 Insomnia, unspecified: Secondary | ICD-10-CM | POA: Diagnosis not present

## 2022-12-31 DIAGNOSIS — G4700 Insomnia, unspecified: Secondary | ICD-10-CM | POA: Diagnosis not present

## 2022-12-31 DIAGNOSIS — K9403 Colostomy malfunction: Secondary | ICD-10-CM | POA: Diagnosis not present

## 2022-12-31 DIAGNOSIS — G3189 Other specified degenerative diseases of nervous system: Secondary | ICD-10-CM | POA: Diagnosis not present

## 2022-12-31 DIAGNOSIS — I70232 Atherosclerosis of native arteries of right leg with ulceration of calf: Secondary | ICD-10-CM | POA: Diagnosis not present

## 2022-12-31 DIAGNOSIS — E1151 Type 2 diabetes mellitus with diabetic peripheral angiopathy without gangrene: Secondary | ICD-10-CM | POA: Diagnosis not present

## 2022-12-31 DIAGNOSIS — Z4800 Encounter for change or removal of nonsurgical wound dressing: Secondary | ICD-10-CM | POA: Diagnosis not present

## 2022-12-31 DIAGNOSIS — L97212 Non-pressure chronic ulcer of right calf with fat layer exposed: Secondary | ICD-10-CM | POA: Diagnosis not present

## 2022-12-31 DIAGNOSIS — F01A18 Vascular dementia, mild, with other behavioral disturbance: Secondary | ICD-10-CM | POA: Diagnosis not present

## 2022-12-31 DIAGNOSIS — F02818 Dementia in other diseases classified elsewhere, unspecified severity, with other behavioral disturbance: Secondary | ICD-10-CM | POA: Diagnosis not present

## 2023-01-03 DIAGNOSIS — F01A18 Vascular dementia, mild, with other behavioral disturbance: Secondary | ICD-10-CM | POA: Diagnosis not present

## 2023-01-03 DIAGNOSIS — Z4800 Encounter for change or removal of nonsurgical wound dressing: Secondary | ICD-10-CM | POA: Diagnosis not present

## 2023-01-03 DIAGNOSIS — L97212 Non-pressure chronic ulcer of right calf with fat layer exposed: Secondary | ICD-10-CM | POA: Diagnosis not present

## 2023-01-03 DIAGNOSIS — E1151 Type 2 diabetes mellitus with diabetic peripheral angiopathy without gangrene: Secondary | ICD-10-CM | POA: Diagnosis not present

## 2023-01-03 DIAGNOSIS — I70232 Atherosclerosis of native arteries of right leg with ulceration of calf: Secondary | ICD-10-CM | POA: Diagnosis not present

## 2023-01-03 DIAGNOSIS — G4700 Insomnia, unspecified: Secondary | ICD-10-CM | POA: Diagnosis not present

## 2023-01-03 DIAGNOSIS — K9403 Colostomy malfunction: Secondary | ICD-10-CM | POA: Diagnosis not present

## 2023-01-03 DIAGNOSIS — G3189 Other specified degenerative diseases of nervous system: Secondary | ICD-10-CM | POA: Diagnosis not present

## 2023-01-03 DIAGNOSIS — F02818 Dementia in other diseases classified elsewhere, unspecified severity, with other behavioral disturbance: Secondary | ICD-10-CM | POA: Diagnosis not present

## 2023-01-07 DIAGNOSIS — F01A18 Vascular dementia, mild, with other behavioral disturbance: Secondary | ICD-10-CM | POA: Diagnosis not present

## 2023-01-07 DIAGNOSIS — L97212 Non-pressure chronic ulcer of right calf with fat layer exposed: Secondary | ICD-10-CM | POA: Diagnosis not present

## 2023-01-07 DIAGNOSIS — Z4800 Encounter for change or removal of nonsurgical wound dressing: Secondary | ICD-10-CM | POA: Diagnosis not present

## 2023-01-07 DIAGNOSIS — F02818 Dementia in other diseases classified elsewhere, unspecified severity, with other behavioral disturbance: Secondary | ICD-10-CM | POA: Diagnosis not present

## 2023-01-07 DIAGNOSIS — E1151 Type 2 diabetes mellitus with diabetic peripheral angiopathy without gangrene: Secondary | ICD-10-CM | POA: Diagnosis not present

## 2023-01-07 DIAGNOSIS — G4700 Insomnia, unspecified: Secondary | ICD-10-CM | POA: Diagnosis not present

## 2023-01-07 DIAGNOSIS — G3189 Other specified degenerative diseases of nervous system: Secondary | ICD-10-CM | POA: Diagnosis not present

## 2023-01-07 DIAGNOSIS — I70232 Atherosclerosis of native arteries of right leg with ulceration of calf: Secondary | ICD-10-CM | POA: Diagnosis not present

## 2023-01-07 DIAGNOSIS — K9403 Colostomy malfunction: Secondary | ICD-10-CM | POA: Diagnosis not present

## 2023-01-10 DIAGNOSIS — G3189 Other specified degenerative diseases of nervous system: Secondary | ICD-10-CM | POA: Diagnosis not present

## 2023-01-10 DIAGNOSIS — L97212 Non-pressure chronic ulcer of right calf with fat layer exposed: Secondary | ICD-10-CM | POA: Diagnosis not present

## 2023-01-10 DIAGNOSIS — Z4800 Encounter for change or removal of nonsurgical wound dressing: Secondary | ICD-10-CM | POA: Diagnosis not present

## 2023-01-10 DIAGNOSIS — K9403 Colostomy malfunction: Secondary | ICD-10-CM | POA: Diagnosis not present

## 2023-01-10 DIAGNOSIS — F01A18 Vascular dementia, mild, with other behavioral disturbance: Secondary | ICD-10-CM | POA: Diagnosis not present

## 2023-01-10 DIAGNOSIS — G4700 Insomnia, unspecified: Secondary | ICD-10-CM | POA: Diagnosis not present

## 2023-01-10 DIAGNOSIS — I70232 Atherosclerosis of native arteries of right leg with ulceration of calf: Secondary | ICD-10-CM | POA: Diagnosis not present

## 2023-01-10 DIAGNOSIS — E1151 Type 2 diabetes mellitus with diabetic peripheral angiopathy without gangrene: Secondary | ICD-10-CM | POA: Diagnosis not present

## 2023-01-10 DIAGNOSIS — F02818 Dementia in other diseases classified elsewhere, unspecified severity, with other behavioral disturbance: Secondary | ICD-10-CM | POA: Diagnosis not present

## 2023-01-14 DIAGNOSIS — F331 Major depressive disorder, recurrent, moderate: Secondary | ICD-10-CM | POA: Diagnosis not present

## 2023-01-14 DIAGNOSIS — F01B2 Vascular dementia, moderate, with psychotic disturbance: Secondary | ICD-10-CM | POA: Diagnosis not present

## 2023-01-14 DIAGNOSIS — F5105 Insomnia due to other mental disorder: Secondary | ICD-10-CM | POA: Diagnosis not present

## 2023-01-14 DIAGNOSIS — I10 Essential (primary) hypertension: Secondary | ICD-10-CM | POA: Diagnosis not present

## 2023-01-15 DIAGNOSIS — F02818 Dementia in other diseases classified elsewhere, unspecified severity, with other behavioral disturbance: Secondary | ICD-10-CM | POA: Diagnosis not present

## 2023-01-15 DIAGNOSIS — E1151 Type 2 diabetes mellitus with diabetic peripheral angiopathy without gangrene: Secondary | ICD-10-CM | POA: Diagnosis not present

## 2023-01-15 DIAGNOSIS — K9403 Colostomy malfunction: Secondary | ICD-10-CM | POA: Diagnosis not present

## 2023-01-15 DIAGNOSIS — L97212 Non-pressure chronic ulcer of right calf with fat layer exposed: Secondary | ICD-10-CM | POA: Diagnosis not present

## 2023-01-15 DIAGNOSIS — I70232 Atherosclerosis of native arteries of right leg with ulceration of calf: Secondary | ICD-10-CM | POA: Diagnosis not present

## 2023-01-15 DIAGNOSIS — G3189 Other specified degenerative diseases of nervous system: Secondary | ICD-10-CM | POA: Diagnosis not present

## 2023-01-15 DIAGNOSIS — Z4800 Encounter for change or removal of nonsurgical wound dressing: Secondary | ICD-10-CM | POA: Diagnosis not present

## 2023-01-15 DIAGNOSIS — F01A18 Vascular dementia, mild, with other behavioral disturbance: Secondary | ICD-10-CM | POA: Diagnosis not present

## 2023-01-15 DIAGNOSIS — G4700 Insomnia, unspecified: Secondary | ICD-10-CM | POA: Diagnosis not present

## 2023-01-17 DIAGNOSIS — Z4800 Encounter for change or removal of nonsurgical wound dressing: Secondary | ICD-10-CM | POA: Diagnosis not present

## 2023-01-17 DIAGNOSIS — K9403 Colostomy malfunction: Secondary | ICD-10-CM | POA: Diagnosis not present

## 2023-01-17 DIAGNOSIS — F01A18 Vascular dementia, mild, with other behavioral disturbance: Secondary | ICD-10-CM | POA: Diagnosis not present

## 2023-01-17 DIAGNOSIS — L97212 Non-pressure chronic ulcer of right calf with fat layer exposed: Secondary | ICD-10-CM | POA: Diagnosis not present

## 2023-01-17 DIAGNOSIS — G3189 Other specified degenerative diseases of nervous system: Secondary | ICD-10-CM | POA: Diagnosis not present

## 2023-01-17 DIAGNOSIS — E1151 Type 2 diabetes mellitus with diabetic peripheral angiopathy without gangrene: Secondary | ICD-10-CM | POA: Diagnosis not present

## 2023-01-17 DIAGNOSIS — F02818 Dementia in other diseases classified elsewhere, unspecified severity, with other behavioral disturbance: Secondary | ICD-10-CM | POA: Diagnosis not present

## 2023-01-17 DIAGNOSIS — G4700 Insomnia, unspecified: Secondary | ICD-10-CM | POA: Diagnosis not present

## 2023-01-17 DIAGNOSIS — I70232 Atherosclerosis of native arteries of right leg with ulceration of calf: Secondary | ICD-10-CM | POA: Diagnosis not present

## 2023-01-21 DIAGNOSIS — L97212 Non-pressure chronic ulcer of right calf with fat layer exposed: Secondary | ICD-10-CM | POA: Diagnosis not present

## 2023-01-21 DIAGNOSIS — E1151 Type 2 diabetes mellitus with diabetic peripheral angiopathy without gangrene: Secondary | ICD-10-CM | POA: Diagnosis not present

## 2023-01-21 DIAGNOSIS — F01A18 Vascular dementia, mild, with other behavioral disturbance: Secondary | ICD-10-CM | POA: Diagnosis not present

## 2023-01-21 DIAGNOSIS — F02818 Dementia in other diseases classified elsewhere, unspecified severity, with other behavioral disturbance: Secondary | ICD-10-CM | POA: Diagnosis not present

## 2023-01-21 DIAGNOSIS — K9403 Colostomy malfunction: Secondary | ICD-10-CM | POA: Diagnosis not present

## 2023-01-21 DIAGNOSIS — G3189 Other specified degenerative diseases of nervous system: Secondary | ICD-10-CM | POA: Diagnosis not present

## 2023-01-21 DIAGNOSIS — I70232 Atherosclerosis of native arteries of right leg with ulceration of calf: Secondary | ICD-10-CM | POA: Diagnosis not present

## 2023-01-21 DIAGNOSIS — G4700 Insomnia, unspecified: Secondary | ICD-10-CM | POA: Diagnosis not present

## 2023-01-21 DIAGNOSIS — Z4800 Encounter for change or removal of nonsurgical wound dressing: Secondary | ICD-10-CM | POA: Diagnosis not present

## 2023-01-24 DIAGNOSIS — G4700 Insomnia, unspecified: Secondary | ICD-10-CM | POA: Diagnosis not present

## 2023-01-24 DIAGNOSIS — I70232 Atherosclerosis of native arteries of right leg with ulceration of calf: Secondary | ICD-10-CM | POA: Diagnosis not present

## 2023-01-24 DIAGNOSIS — L97212 Non-pressure chronic ulcer of right calf with fat layer exposed: Secondary | ICD-10-CM | POA: Diagnosis not present

## 2023-01-24 DIAGNOSIS — K9403 Colostomy malfunction: Secondary | ICD-10-CM | POA: Diagnosis not present

## 2023-01-24 DIAGNOSIS — F01A18 Vascular dementia, mild, with other behavioral disturbance: Secondary | ICD-10-CM | POA: Diagnosis not present

## 2023-01-24 DIAGNOSIS — G3189 Other specified degenerative diseases of nervous system: Secondary | ICD-10-CM | POA: Diagnosis not present

## 2023-01-24 DIAGNOSIS — F02818 Dementia in other diseases classified elsewhere, unspecified severity, with other behavioral disturbance: Secondary | ICD-10-CM | POA: Diagnosis not present

## 2023-01-24 DIAGNOSIS — E1151 Type 2 diabetes mellitus with diabetic peripheral angiopathy without gangrene: Secondary | ICD-10-CM | POA: Diagnosis not present

## 2023-01-24 DIAGNOSIS — Z4800 Encounter for change or removal of nonsurgical wound dressing: Secondary | ICD-10-CM | POA: Diagnosis not present

## 2023-01-29 DIAGNOSIS — F02818 Dementia in other diseases classified elsewhere, unspecified severity, with other behavioral disturbance: Secondary | ICD-10-CM | POA: Diagnosis not present

## 2023-01-29 DIAGNOSIS — E1151 Type 2 diabetes mellitus with diabetic peripheral angiopathy without gangrene: Secondary | ICD-10-CM | POA: Diagnosis not present

## 2023-01-29 DIAGNOSIS — G3189 Other specified degenerative diseases of nervous system: Secondary | ICD-10-CM | POA: Diagnosis not present

## 2023-01-29 DIAGNOSIS — Z4800 Encounter for change or removal of nonsurgical wound dressing: Secondary | ICD-10-CM | POA: Diagnosis not present

## 2023-01-29 DIAGNOSIS — L97212 Non-pressure chronic ulcer of right calf with fat layer exposed: Secondary | ICD-10-CM | POA: Diagnosis not present

## 2023-01-29 DIAGNOSIS — G4700 Insomnia, unspecified: Secondary | ICD-10-CM | POA: Diagnosis not present

## 2023-01-29 DIAGNOSIS — F01A18 Vascular dementia, mild, with other behavioral disturbance: Secondary | ICD-10-CM | POA: Diagnosis not present

## 2023-01-29 DIAGNOSIS — I70232 Atherosclerosis of native arteries of right leg with ulceration of calf: Secondary | ICD-10-CM | POA: Diagnosis not present

## 2023-01-29 DIAGNOSIS — K9403 Colostomy malfunction: Secondary | ICD-10-CM | POA: Diagnosis not present

## 2023-01-31 DIAGNOSIS — F02818 Dementia in other diseases classified elsewhere, unspecified severity, with other behavioral disturbance: Secondary | ICD-10-CM | POA: Diagnosis not present

## 2023-01-31 DIAGNOSIS — G4700 Insomnia, unspecified: Secondary | ICD-10-CM | POA: Diagnosis not present

## 2023-01-31 DIAGNOSIS — I70232 Atherosclerosis of native arteries of right leg with ulceration of calf: Secondary | ICD-10-CM | POA: Diagnosis not present

## 2023-01-31 DIAGNOSIS — L97212 Non-pressure chronic ulcer of right calf with fat layer exposed: Secondary | ICD-10-CM | POA: Diagnosis not present

## 2023-01-31 DIAGNOSIS — Z4800 Encounter for change or removal of nonsurgical wound dressing: Secondary | ICD-10-CM | POA: Diagnosis not present

## 2023-01-31 DIAGNOSIS — F01A18 Vascular dementia, mild, with other behavioral disturbance: Secondary | ICD-10-CM | POA: Diagnosis not present

## 2023-01-31 DIAGNOSIS — G3189 Other specified degenerative diseases of nervous system: Secondary | ICD-10-CM | POA: Diagnosis not present

## 2023-01-31 DIAGNOSIS — E1151 Type 2 diabetes mellitus with diabetic peripheral angiopathy without gangrene: Secondary | ICD-10-CM | POA: Diagnosis not present

## 2023-01-31 DIAGNOSIS — K9403 Colostomy malfunction: Secondary | ICD-10-CM | POA: Diagnosis not present

## 2023-02-05 DIAGNOSIS — I872 Venous insufficiency (chronic) (peripheral): Secondary | ICD-10-CM | POA: Diagnosis not present

## 2023-02-05 DIAGNOSIS — F01A18 Vascular dementia, mild, with other behavioral disturbance: Secondary | ICD-10-CM | POA: Diagnosis not present

## 2023-02-05 DIAGNOSIS — F02818 Dementia in other diseases classified elsewhere, unspecified severity, with other behavioral disturbance: Secondary | ICD-10-CM | POA: Diagnosis not present

## 2023-02-05 DIAGNOSIS — Z4800 Encounter for change or removal of nonsurgical wound dressing: Secondary | ICD-10-CM | POA: Diagnosis not present

## 2023-02-05 DIAGNOSIS — G3189 Other specified degenerative diseases of nervous system: Secondary | ICD-10-CM | POA: Diagnosis not present

## 2023-02-05 DIAGNOSIS — I70232 Atherosclerosis of native arteries of right leg with ulceration of calf: Secondary | ICD-10-CM | POA: Diagnosis not present

## 2023-02-05 DIAGNOSIS — G4700 Insomnia, unspecified: Secondary | ICD-10-CM | POA: Diagnosis not present

## 2023-02-05 DIAGNOSIS — L97212 Non-pressure chronic ulcer of right calf with fat layer exposed: Secondary | ICD-10-CM | POA: Diagnosis not present

## 2023-02-05 DIAGNOSIS — E1151 Type 2 diabetes mellitus with diabetic peripheral angiopathy without gangrene: Secondary | ICD-10-CM | POA: Diagnosis not present

## 2023-02-07 DIAGNOSIS — F02818 Dementia in other diseases classified elsewhere, unspecified severity, with other behavioral disturbance: Secondary | ICD-10-CM | POA: Diagnosis not present

## 2023-02-07 DIAGNOSIS — F01A18 Vascular dementia, mild, with other behavioral disturbance: Secondary | ICD-10-CM | POA: Diagnosis not present

## 2023-02-07 DIAGNOSIS — I872 Venous insufficiency (chronic) (peripheral): Secondary | ICD-10-CM | POA: Diagnosis not present

## 2023-02-07 DIAGNOSIS — Z4800 Encounter for change or removal of nonsurgical wound dressing: Secondary | ICD-10-CM | POA: Diagnosis not present

## 2023-02-07 DIAGNOSIS — G3189 Other specified degenerative diseases of nervous system: Secondary | ICD-10-CM | POA: Diagnosis not present

## 2023-02-07 DIAGNOSIS — I70232 Atherosclerosis of native arteries of right leg with ulceration of calf: Secondary | ICD-10-CM | POA: Diagnosis not present

## 2023-02-07 DIAGNOSIS — L97212 Non-pressure chronic ulcer of right calf with fat layer exposed: Secondary | ICD-10-CM | POA: Diagnosis not present

## 2023-02-07 DIAGNOSIS — E1151 Type 2 diabetes mellitus with diabetic peripheral angiopathy without gangrene: Secondary | ICD-10-CM | POA: Diagnosis not present

## 2023-02-07 DIAGNOSIS — G4700 Insomnia, unspecified: Secondary | ICD-10-CM | POA: Diagnosis not present

## 2023-02-12 DIAGNOSIS — F02818 Dementia in other diseases classified elsewhere, unspecified severity, with other behavioral disturbance: Secondary | ICD-10-CM | POA: Diagnosis not present

## 2023-02-12 DIAGNOSIS — G3189 Other specified degenerative diseases of nervous system: Secondary | ICD-10-CM | POA: Diagnosis not present

## 2023-02-12 DIAGNOSIS — L97212 Non-pressure chronic ulcer of right calf with fat layer exposed: Secondary | ICD-10-CM | POA: Diagnosis not present

## 2023-02-12 DIAGNOSIS — I872 Venous insufficiency (chronic) (peripheral): Secondary | ICD-10-CM | POA: Diagnosis not present

## 2023-02-12 DIAGNOSIS — E1151 Type 2 diabetes mellitus with diabetic peripheral angiopathy without gangrene: Secondary | ICD-10-CM | POA: Diagnosis not present

## 2023-02-12 DIAGNOSIS — I70232 Atherosclerosis of native arteries of right leg with ulceration of calf: Secondary | ICD-10-CM | POA: Diagnosis not present

## 2023-02-12 DIAGNOSIS — G4700 Insomnia, unspecified: Secondary | ICD-10-CM | POA: Diagnosis not present

## 2023-02-12 DIAGNOSIS — Z4800 Encounter for change or removal of nonsurgical wound dressing: Secondary | ICD-10-CM | POA: Diagnosis not present

## 2023-02-12 DIAGNOSIS — F01A18 Vascular dementia, mild, with other behavioral disturbance: Secondary | ICD-10-CM | POA: Diagnosis not present

## 2023-02-14 DIAGNOSIS — I70232 Atherosclerosis of native arteries of right leg with ulceration of calf: Secondary | ICD-10-CM | POA: Diagnosis not present

## 2023-02-14 DIAGNOSIS — E1151 Type 2 diabetes mellitus with diabetic peripheral angiopathy without gangrene: Secondary | ICD-10-CM | POA: Diagnosis not present

## 2023-02-14 DIAGNOSIS — G3189 Other specified degenerative diseases of nervous system: Secondary | ICD-10-CM | POA: Diagnosis not present

## 2023-02-14 DIAGNOSIS — L97212 Non-pressure chronic ulcer of right calf with fat layer exposed: Secondary | ICD-10-CM | POA: Diagnosis not present

## 2023-02-14 DIAGNOSIS — Z4800 Encounter for change or removal of nonsurgical wound dressing: Secondary | ICD-10-CM | POA: Diagnosis not present

## 2023-02-14 DIAGNOSIS — F01A18 Vascular dementia, mild, with other behavioral disturbance: Secondary | ICD-10-CM | POA: Diagnosis not present

## 2023-02-14 DIAGNOSIS — I872 Venous insufficiency (chronic) (peripheral): Secondary | ICD-10-CM | POA: Diagnosis not present

## 2023-02-14 DIAGNOSIS — F02818 Dementia in other diseases classified elsewhere, unspecified severity, with other behavioral disturbance: Secondary | ICD-10-CM | POA: Diagnosis not present

## 2023-02-14 DIAGNOSIS — G4700 Insomnia, unspecified: Secondary | ICD-10-CM | POA: Diagnosis not present

## 2023-02-18 DIAGNOSIS — G3189 Other specified degenerative diseases of nervous system: Secondary | ICD-10-CM | POA: Diagnosis not present

## 2023-02-18 DIAGNOSIS — Z4800 Encounter for change or removal of nonsurgical wound dressing: Secondary | ICD-10-CM | POA: Diagnosis not present

## 2023-02-18 DIAGNOSIS — F01A18 Vascular dementia, mild, with other behavioral disturbance: Secondary | ICD-10-CM | POA: Diagnosis not present

## 2023-02-18 DIAGNOSIS — G4700 Insomnia, unspecified: Secondary | ICD-10-CM | POA: Diagnosis not present

## 2023-02-18 DIAGNOSIS — I872 Venous insufficiency (chronic) (peripheral): Secondary | ICD-10-CM | POA: Diagnosis not present

## 2023-02-18 DIAGNOSIS — L97212 Non-pressure chronic ulcer of right calf with fat layer exposed: Secondary | ICD-10-CM | POA: Diagnosis not present

## 2023-02-18 DIAGNOSIS — F02818 Dementia in other diseases classified elsewhere, unspecified severity, with other behavioral disturbance: Secondary | ICD-10-CM | POA: Diagnosis not present

## 2023-02-18 DIAGNOSIS — E1151 Type 2 diabetes mellitus with diabetic peripheral angiopathy without gangrene: Secondary | ICD-10-CM | POA: Diagnosis not present

## 2023-02-18 DIAGNOSIS — I70232 Atherosclerosis of native arteries of right leg with ulceration of calf: Secondary | ICD-10-CM | POA: Diagnosis not present

## 2023-02-21 DIAGNOSIS — G4700 Insomnia, unspecified: Secondary | ICD-10-CM | POA: Diagnosis not present

## 2023-02-21 DIAGNOSIS — L97212 Non-pressure chronic ulcer of right calf with fat layer exposed: Secondary | ICD-10-CM | POA: Diagnosis not present

## 2023-02-21 DIAGNOSIS — G3189 Other specified degenerative diseases of nervous system: Secondary | ICD-10-CM | POA: Diagnosis not present

## 2023-02-21 DIAGNOSIS — I872 Venous insufficiency (chronic) (peripheral): Secondary | ICD-10-CM | POA: Diagnosis not present

## 2023-02-21 DIAGNOSIS — F02818 Dementia in other diseases classified elsewhere, unspecified severity, with other behavioral disturbance: Secondary | ICD-10-CM | POA: Diagnosis not present

## 2023-02-21 DIAGNOSIS — I70232 Atherosclerosis of native arteries of right leg with ulceration of calf: Secondary | ICD-10-CM | POA: Diagnosis not present

## 2023-02-21 DIAGNOSIS — Z4800 Encounter for change or removal of nonsurgical wound dressing: Secondary | ICD-10-CM | POA: Diagnosis not present

## 2023-02-21 DIAGNOSIS — F01A18 Vascular dementia, mild, with other behavioral disturbance: Secondary | ICD-10-CM | POA: Diagnosis not present

## 2023-02-21 DIAGNOSIS — E1151 Type 2 diabetes mellitus with diabetic peripheral angiopathy without gangrene: Secondary | ICD-10-CM | POA: Diagnosis not present

## 2023-02-25 DIAGNOSIS — L97212 Non-pressure chronic ulcer of right calf with fat layer exposed: Secondary | ICD-10-CM | POA: Diagnosis not present

## 2023-02-25 DIAGNOSIS — I70232 Atherosclerosis of native arteries of right leg with ulceration of calf: Secondary | ICD-10-CM | POA: Diagnosis not present

## 2023-02-25 DIAGNOSIS — G3189 Other specified degenerative diseases of nervous system: Secondary | ICD-10-CM | POA: Diagnosis not present

## 2023-02-25 DIAGNOSIS — E1151 Type 2 diabetes mellitus with diabetic peripheral angiopathy without gangrene: Secondary | ICD-10-CM | POA: Diagnosis not present

## 2023-02-25 DIAGNOSIS — F02818 Dementia in other diseases classified elsewhere, unspecified severity, with other behavioral disturbance: Secondary | ICD-10-CM | POA: Diagnosis not present

## 2023-02-25 DIAGNOSIS — F01A18 Vascular dementia, mild, with other behavioral disturbance: Secondary | ICD-10-CM | POA: Diagnosis not present

## 2023-02-25 DIAGNOSIS — G4700 Insomnia, unspecified: Secondary | ICD-10-CM | POA: Diagnosis not present

## 2023-02-25 DIAGNOSIS — I872 Venous insufficiency (chronic) (peripheral): Secondary | ICD-10-CM | POA: Diagnosis not present

## 2023-02-25 DIAGNOSIS — Z4800 Encounter for change or removal of nonsurgical wound dressing: Secondary | ICD-10-CM | POA: Diagnosis not present

## 2023-02-28 DIAGNOSIS — E1151 Type 2 diabetes mellitus with diabetic peripheral angiopathy without gangrene: Secondary | ICD-10-CM | POA: Diagnosis not present

## 2023-02-28 DIAGNOSIS — G4700 Insomnia, unspecified: Secondary | ICD-10-CM | POA: Diagnosis not present

## 2023-02-28 DIAGNOSIS — L97212 Non-pressure chronic ulcer of right calf with fat layer exposed: Secondary | ICD-10-CM | POA: Diagnosis not present

## 2023-02-28 DIAGNOSIS — I872 Venous insufficiency (chronic) (peripheral): Secondary | ICD-10-CM | POA: Diagnosis not present

## 2023-02-28 DIAGNOSIS — F01A18 Vascular dementia, mild, with other behavioral disturbance: Secondary | ICD-10-CM | POA: Diagnosis not present

## 2023-02-28 DIAGNOSIS — G3189 Other specified degenerative diseases of nervous system: Secondary | ICD-10-CM | POA: Diagnosis not present

## 2023-02-28 DIAGNOSIS — I70232 Atherosclerosis of native arteries of right leg with ulceration of calf: Secondary | ICD-10-CM | POA: Diagnosis not present

## 2023-02-28 DIAGNOSIS — Z4800 Encounter for change or removal of nonsurgical wound dressing: Secondary | ICD-10-CM | POA: Diagnosis not present

## 2023-02-28 DIAGNOSIS — F02818 Dementia in other diseases classified elsewhere, unspecified severity, with other behavioral disturbance: Secondary | ICD-10-CM | POA: Diagnosis not present

## 2024-06-08 ENCOUNTER — Other Ambulatory Visit (INDEPENDENT_AMBULATORY_CARE_PROVIDER_SITE_OTHER): Payer: Self-pay | Admitting: Vascular Surgery

## 2024-06-08 DIAGNOSIS — R600 Localized edema: Secondary | ICD-10-CM

## 2024-06-08 DIAGNOSIS — M79604 Pain in right leg: Secondary | ICD-10-CM

## 2024-06-09 ENCOUNTER — Ambulatory Visit (INDEPENDENT_AMBULATORY_CARE_PROVIDER_SITE_OTHER)

## 2024-06-09 ENCOUNTER — Encounter (INDEPENDENT_AMBULATORY_CARE_PROVIDER_SITE_OTHER): Payer: Self-pay | Admitting: Vascular Surgery

## 2024-06-09 ENCOUNTER — Ambulatory Visit (INDEPENDENT_AMBULATORY_CARE_PROVIDER_SITE_OTHER): Admitting: Vascular Surgery

## 2024-06-09 VITALS — BP 139/83 | HR 72 | Resp 16 | Ht 67.0 in | Wt 185.0 lb

## 2024-06-09 DIAGNOSIS — I890 Lymphedema, not elsewhere classified: Secondary | ICD-10-CM | POA: Diagnosis not present

## 2024-06-09 DIAGNOSIS — M79604 Pain in right leg: Secondary | ICD-10-CM | POA: Diagnosis not present

## 2024-06-09 DIAGNOSIS — I10 Essential (primary) hypertension: Secondary | ICD-10-CM

## 2024-06-09 DIAGNOSIS — R600 Localized edema: Secondary | ICD-10-CM | POA: Diagnosis not present

## 2024-06-09 DIAGNOSIS — M79605 Pain in left leg: Secondary | ICD-10-CM | POA: Diagnosis not present

## 2024-06-09 NOTE — Progress Notes (Signed)
 Subjective:    Patient ID: Scott Matthews, male    DOB: Jul 03, 1946, 78 y.o.   MRN: [id number] Chief Complaint  Patient presents with   New Patient (Initial Visit)    Ref [facility] consult bilateral edema, pain, and redness    Earon Rivest is an 78 yo male who presents to clinic today as a consult for bilateral lower extremity reflux and edema with pain and redness.  Patient endorses he lives at the [facility] and has nursing care available.  They have been changing his wraps to his lower extremities for the last 4 years on and off.  He is unable to tell me when the last time he had wraps applied to his lower extremities.  He does show up with bilateral lower extremity swelling and skin tears to his lower extremities.  Patient states that he has a follow-up with the wound care clinic.  He comes today for ultrasounds of his lower extremities.    Review of Systems  Constitutional: Negative.   Cardiovascular:  Positive for leg swelling.       Patient's had years of bilateral lower extremity lymphedema and leg wraps.  Musculoskeletal:  Positive for myalgias.  Skin:  Positive for wound.       Multiple areas of skin tears on his bilateral lower extremities.  All other systems reviewed and are negative.      Objective:   Physical Exam Vitals reviewed.  Constitutional:      Appearance: Normal appearance. He is normal weight.  HENT:     Head: Normocephalic.  Eyes:     Pupils: Pupils are equal, round, and reactive to light.  Cardiovascular:     Rate and Rhythm: Normal rate and regular rhythm.     Pulses: Normal pulses.     Heart sounds: Normal heart sounds.  Pulmonary:     Effort: Pulmonary effort is normal.     Breath sounds: Normal breath sounds.  Abdominal:     General: Abdomen is flat. Bowel sounds are normal.     Palpations: Abdomen is soft.  Musculoskeletal:        General: Swelling and tenderness present.     Right lower leg: Edema present.     Left lower leg:  Edema present.  Skin:    General: Skin is warm and dry.     Capillary Refill: Capillary refill takes 2 to 3 seconds.  Neurological:     General: No focal deficit present.     Mental Status: He is alert and oriented to person, place, and time. Mental status is at baseline.  Psychiatric:        Mood and Affect: Mood normal.        Behavior: Behavior normal.        Thought Content: Thought content normal.        Judgment: Judgment normal.     BP 139/83   Pulse 72   Resp 16   Ht 5' 7 (1.702 m)   Wt 185 lb (83.9 kg)   BMI 28.98 kg/m   Past Medical History:  Diagnosis Date   Hypertension    Medical history non-contributory     Social History   Socioeconomic History   Marital status: Single    Spouse name: Not on file   Number of children: Not on file   Years of education: Not on file   Highest education level: Not on file  Occupational History   Not on file  Tobacco Use  Smoking status: Never   Smokeless tobacco: Never  Vaping Use   Vaping status: Never Used  Substance and Sexual Activity   Alcohol use: No   Drug use: No   Sexual activity: Not on file  Other Topics Concern   Not on file  Social History Narrative   Not on file   Social Drivers of Health   Financial Resource Strain: Not on file  Food Insecurity: Not on file  Transportation Needs: Not on file  Physical Activity: Not on file  Stress: Not on file  Social Connections: Not on file  Intimate Partner Violence: Not on file    Past Surgical History:  Procedure Laterality Date   COLON SURGERY     COLOSTOMY      Family History  Problem Relation Age of Onset   Hypertension Mother    Hypertension Father    CAD Father     No Known Allergies     Latest Ref Rng & Units 09/17/2021    1:05 AM 09/16/2021    1:06 AM 09/15/2021   11:56 AM  CBC  WBC 4.0 - 10.5 K/uL 7.9  6.4    Hemoglobin 13.0 - 17.0 g/dL 87.6  88.4  86.9   Hematocrit 39.0 - 52.0 % 37.0  35.8  42.1   Platelets 150 - 400 K/uL  132  122        CMP     Component Value Date/Time   NA 135 09/17/2021 0105   K 3.4 (L) 09/17/2021 0105   CL 104 09/17/2021 0105   CO2 25 09/17/2021 0105   GLUCOSE 119 (H) 09/17/2021 0105   BUN 21 09/17/2021 0105   CREATININE 1.26 (H) 09/17/2021 0105   CALCIUM 8.1 (L) 09/17/2021 0105   PROT 5.7 (L) 09/16/2021 0106   ALBUMIN 2.7 (L) 09/16/2021 0106   AST 45 (H) 09/16/2021 0106   ALT 38 09/16/2021 0106   ALKPHOS 45 09/16/2021 0106   BILITOT 0.7 09/16/2021 0106   GFRNONAA 59 (L) 09/17/2021 0105     No results found.     Assessment & Plan:   1. Lymphedema (Primary) Patient presents today with a long history of bilateral lower extremity edema.  He has had years of Unna boot wraps on and off going back to 2016 in his chart.  He presents today as a consult to address his lower extremity lymphedema.  Patient currently lives at the [facility] house and is being cared for by the staff there and getting his lower extremities wrapped on a weekly basis.  He presents today and underwent a venous ultrasound of his bilateral lower extremities.  He does not have any reflux nor does he have any DVTs.  His lower extremity swelling is consistently been lymphedema.  He has multiple skin tears throughout his lower extremities.  Patient has a follow-up with the wound care clinic in a week and I suggest I recommend that he follow-up with wound care at this time.  Patient requested to follow-up with vein and vascular surgery in 6 weeks for another reevaluation of his lower extremity lymphedema.  If the patient's lymphedema has resolved he needs to start wearing compression stockings/socks to his lower extremities on a daily basis which I believe he has been noncompliant in doing.  2. Essential hypertension Continue antihypertensive medications as already ordered, these medications have been reviewed and there are no changes at this time.   Current Outpatient Medications on File Prior to Visit   Medication Sig Dispense Refill  amLODipine  (NORVASC ) 5 MG tablet Take 1 tablet (5 mg total) by mouth daily. 30 tablet 0   benazepril -hydrochlorthiazide (LOTENSIN  HCT) 10-12.5 MG tablet Take 1 tablet by mouth daily. 30 tablet 0   latanoprost (XALATAN) 0.005 % ophthalmic solution      metoprolol  tartrate (LOPRESSOR ) 50 MG tablet Take by mouth.     traMADol (ULTRAM) 50 MG tablet Take by mouth.     No current facility-administered medications on file prior to visit.    There are no Patient Instructions on file for this visit. No follow-ups on file.   Gwendlyn JONELLE Shank, NP

## 2024-06-10 ENCOUNTER — Encounter (HOSPITAL_BASED_OUTPATIENT_CLINIC_OR_DEPARTMENT_OTHER): Attending: Internal Medicine | Admitting: Internal Medicine

## 2024-06-10 DIAGNOSIS — I87311 Chronic venous hypertension (idiopathic) with ulcer of right lower extremity: Secondary | ICD-10-CM | POA: Insufficient documentation

## 2024-06-10 DIAGNOSIS — L97812 Non-pressure chronic ulcer of other part of right lower leg with fat layer exposed: Secondary | ICD-10-CM | POA: Diagnosis present

## 2024-06-17 ENCOUNTER — Ambulatory Visit (HOSPITAL_BASED_OUTPATIENT_CLINIC_OR_DEPARTMENT_OTHER): Admitting: Internal Medicine

## 2024-06-24 ENCOUNTER — Ambulatory Visit (HOSPITAL_BASED_OUTPATIENT_CLINIC_OR_DEPARTMENT_OTHER): Admitting: Internal Medicine

## 2024-07-01 ENCOUNTER — Encounter (HOSPITAL_BASED_OUTPATIENT_CLINIC_OR_DEPARTMENT_OTHER): Attending: Internal Medicine | Admitting: Internal Medicine

## 2024-07-01 DIAGNOSIS — I87311 Chronic venous hypertension (idiopathic) with ulcer of right lower extremity: Secondary | ICD-10-CM | POA: Diagnosis not present

## 2024-07-01 DIAGNOSIS — L97812 Non-pressure chronic ulcer of other part of right lower leg with fat layer exposed: Secondary | ICD-10-CM | POA: Diagnosis not present

## 2024-07-15 ENCOUNTER — Encounter (HOSPITAL_BASED_OUTPATIENT_CLINIC_OR_DEPARTMENT_OTHER): Admitting: Internal Medicine

## 2024-07-15 DIAGNOSIS — I87311 Chronic venous hypertension (idiopathic) with ulcer of right lower extremity: Secondary | ICD-10-CM | POA: Diagnosis not present

## 2024-07-15 DIAGNOSIS — L97812 Non-pressure chronic ulcer of other part of right lower leg with fat layer exposed: Secondary | ICD-10-CM | POA: Diagnosis not present

## 2024-07-21 ENCOUNTER — Ambulatory Visit (INDEPENDENT_AMBULATORY_CARE_PROVIDER_SITE_OTHER): Admitting: Vascular Surgery

## 2024-07-28 ENCOUNTER — Encounter (HOSPITAL_BASED_OUTPATIENT_CLINIC_OR_DEPARTMENT_OTHER): Attending: Internal Medicine | Admitting: Internal Medicine

## 2024-07-28 DIAGNOSIS — I87311 Chronic venous hypertension (idiopathic) with ulcer of right lower extremity: Secondary | ICD-10-CM

## 2024-07-28 DIAGNOSIS — L97812 Non-pressure chronic ulcer of other part of right lower leg with fat layer exposed: Secondary | ICD-10-CM | POA: Diagnosis not present
